# Patient Record
Sex: Male | Born: 1945 | Race: White | Hispanic: No | Marital: Married | Smoking: Never smoker
Health system: Southern US, Community
[De-identification: ages and names within clinical notes are randomized; demographics above are authoritative.]

## PROBLEM LIST (undated history)

## (undated) DIAGNOSIS — Q549 Hypospadias, unspecified: Secondary | ICD-10-CM

## (undated) DIAGNOSIS — E119 Type 2 diabetes mellitus without complications: Secondary | ICD-10-CM

## (undated) DIAGNOSIS — D75839 Thrombocytosis, unspecified: Secondary | ICD-10-CM

## (undated) DIAGNOSIS — D473 Essential (hemorrhagic) thrombocythemia: Secondary | ICD-10-CM

## (undated) DIAGNOSIS — M4628 Osteomyelitis of vertebra, sacral and sacrococcygeal region: Secondary | ICD-10-CM

## (undated) DIAGNOSIS — I10 Essential (primary) hypertension: Secondary | ICD-10-CM

## (undated) DIAGNOSIS — L89154 Pressure ulcer of sacral region, stage 4: Secondary | ICD-10-CM

## (undated) DIAGNOSIS — I5042 Chronic combined systolic (congestive) and diastolic (congestive) heart failure: Secondary | ICD-10-CM

## (undated) DIAGNOSIS — M7071 Other bursitis of hip, right hip: Secondary | ICD-10-CM

## (undated) DIAGNOSIS — E039 Hypothyroidism, unspecified: Secondary | ICD-10-CM

## (undated) HISTORY — PX: CERVICAL SPINE SURGERY: SHX589

## (undated) HISTORY — PX: TOTAL HIP ARTHROPLASTY: SHX124

## (undated) HISTORY — PX: HERNIA REPAIR: SHX51

---

## 2012-02-25 HISTORY — PX: CORONARY ARTERY BYPASS GRAFT: SHX141

## 2013-02-24 HISTORY — PX: BACK SURGERY: SHX140

## 2016-08-23 ENCOUNTER — Inpatient Hospital Stay
Admission: RE | Admit: 2016-08-23 | Discharge: 2016-09-19 | Disposition: A | Discharge status: Rehabilitation Facility | Payer: Self-pay | Source: Other Acute Inpatient Hospital | Attending: Internal Medicine | Admitting: Internal Medicine

## 2016-08-23 ENCOUNTER — Other Ambulatory Visit (HOSPITAL_COMMUNITY): Payer: Self-pay

## 2016-08-23 DIAGNOSIS — R109 Unspecified abdominal pain: Secondary | ICD-10-CM

## 2016-08-23 DIAGNOSIS — Z931 Gastrostomy status: Secondary | ICD-10-CM

## 2016-08-23 DIAGNOSIS — R1310 Dysphagia, unspecified: Secondary | ICD-10-CM

## 2016-08-23 DIAGNOSIS — J9690 Respiratory failure, unspecified, unspecified whether with hypoxia or hypercapnia: Secondary | ICD-10-CM

## 2016-08-23 DIAGNOSIS — R1032 Left lower quadrant pain: Secondary | ICD-10-CM

## 2016-08-23 DIAGNOSIS — R58 Hemorrhage, not elsewhere classified: Secondary | ICD-10-CM

## 2016-08-23 DIAGNOSIS — J189 Pneumonia, unspecified organism: Secondary | ICD-10-CM

## 2016-08-23 DIAGNOSIS — R51 Headache: Secondary | ICD-10-CM

## 2016-08-23 DIAGNOSIS — R519 Headache, unspecified: Secondary | ICD-10-CM

## 2016-08-23 DIAGNOSIS — Z452 Encounter for adjustment and management of vascular access device: Secondary | ICD-10-CM

## 2016-08-23 HISTORY — DX: Essential (primary) hypertension: I10

## 2016-08-23 HISTORY — DX: Osteomyelitis of vertebra, sacral and sacrococcygeal region: M46.28

## 2016-08-23 HISTORY — DX: Pressure ulcer of sacral region, stage 4: L89.154

## 2016-08-23 HISTORY — DX: Hypospadias, unspecified: Q54.9

## 2016-08-23 HISTORY — DX: Thrombocytosis, unspecified: D75.839

## 2016-08-23 HISTORY — DX: Type 2 diabetes mellitus without complications: E11.9

## 2016-08-23 HISTORY — DX: Essential (hemorrhagic) thrombocythemia: D47.3

## 2016-08-23 HISTORY — DX: Other bursitis of hip, right hip: M70.71

## 2016-08-23 HISTORY — DX: Chronic combined systolic (congestive) and diastolic (congestive) heart failure: I50.42

## 2016-08-23 HISTORY — DX: Hypothyroidism, unspecified: E03.9

## 2016-08-23 MED ORDER — IOPAMIDOL (ISOVUE-300) INJECTION 61%
INTRAVENOUS | Status: AC
  Administered 2016-08-23: 45 mL via GASTROSTOMY
  Filled 2016-08-23: qty 50

## 2016-08-24 LAB — COMPREHENSIVE METABOLIC PANEL
ALT: 14 U/L — AB (ref 17–63)
AST: 17 U/L (ref 15–41)
Albumin: 1.8 g/dL — ABNORMAL LOW (ref 3.5–5.0)
Alkaline Phosphatase: 77 U/L (ref 38–126)
Anion gap: 11 (ref 5–15)
BILIRUBIN TOTAL: 0.5 mg/dL (ref 0.3–1.2)
BUN: 38 mg/dL — AB (ref 6–20)
CALCIUM: 9.5 mg/dL (ref 8.9–10.3)
CO2: 29 mmol/L (ref 22–32)
CREATININE: 1.21 mg/dL (ref 0.61–1.24)
Chloride: 103 mmol/L (ref 101–111)
GFR, EST NON AFRICAN AMERICAN: 59 mL/min — AB (ref >60)
Glucose, Bld: 116 mg/dL — ABNORMAL HIGH (ref 65–99)
Potassium: 3.8 mmol/L (ref 3.5–5.1)
Sodium: 143 mmol/L (ref 135–145)
TOTAL PROTEIN: 6.3 g/dL — AB (ref 6.5–8.1)

## 2016-08-24 LAB — CBC
HCT: 31.7 % — ABNORMAL LOW (ref 39.0–52.0)
Hemoglobin: 9.1 g/dL — ABNORMAL LOW (ref 13.0–17.0)
MCH: 25.9 pg — ABNORMAL LOW (ref 26.0–34.0)
MCHC: 28.7 g/dL — ABNORMAL LOW (ref 30.0–36.0)
MCV: 90.3 fL (ref 78.0–100.0)
PLATELETS: 259 10*3/uL (ref 150–400)
RBC: 3.51 MIL/uL — AB (ref 4.22–5.81)
RDW: 18.1 % — AB (ref 11.5–15.5)
WBC: 9.9 10*3/uL (ref 4.0–10.5)

## 2016-08-28 ENCOUNTER — Other Ambulatory Visit (HOSPITAL_COMMUNITY): Payer: Self-pay

## 2016-08-30 LAB — CBC
HEMATOCRIT: 31 % — AB (ref 39.0–52.0)
HEMOGLOBIN: 9.1 g/dL — AB (ref 13.0–17.0)
MCH: 25.8 pg — ABNORMAL LOW (ref 26.0–34.0)
MCHC: 29.4 g/dL — ABNORMAL LOW (ref 30.0–36.0)
MCV: 87.8 fL (ref 78.0–100.0)
PLATELETS: 322 10*3/uL (ref 150–400)
RBC: 3.53 MIL/uL — AB (ref 4.22–5.81)
RDW: 17.8 % — ABNORMAL HIGH (ref 11.5–15.5)
WBC: 15.9 10*3/uL — AB (ref 4.0–10.5)

## 2016-08-30 LAB — BASIC METABOLIC PANEL
ANION GAP: 9 (ref 5–15)
BUN: 67 mg/dL — ABNORMAL HIGH (ref 6–20)
CO2: 32 mmol/L (ref 22–32)
Calcium: 9.3 mg/dL (ref 8.9–10.3)
Chloride: 99 mmol/L — ABNORMAL LOW (ref 101–111)
Creatinine, Ser: 1.49 mg/dL — ABNORMAL HIGH (ref 0.61–1.24)
GFR calc Af Amer: 53 mL/min — ABNORMAL LOW (ref >60)
GFR, EST NON AFRICAN AMERICAN: 46 mL/min — AB (ref >60)
GLUCOSE: 198 mg/dL — AB (ref 65–99)
POTASSIUM: 4 mmol/L (ref 3.5–5.1)
Sodium: 140 mmol/L (ref 135–145)

## 2016-08-30 LAB — TSH: TSH: 0.663 u[IU]/mL (ref 0.350–4.500)

## 2016-08-31 ENCOUNTER — Other Ambulatory Visit (HOSPITAL_COMMUNITY): Payer: Self-pay

## 2016-09-01 LAB — BASIC METABOLIC PANEL
ANION GAP: 12 (ref 5–15)
BUN: 69 mg/dL — ABNORMAL HIGH (ref 6–20)
CHLORIDE: 99 mmol/L — AB (ref 101–111)
CO2: 28 mmol/L (ref 22–32)
CREATININE: 1.33 mg/dL — AB (ref 0.61–1.24)
Calcium: 9.4 mg/dL (ref 8.9–10.3)
GFR calc non Af Amer: 53 mL/min — ABNORMAL LOW (ref >60)
Glucose, Bld: 143 mg/dL — ABNORMAL HIGH (ref 65–99)
POTASSIUM: 4.4 mmol/L (ref 3.5–5.1)
Sodium: 139 mmol/L (ref 135–145)

## 2016-09-02 ENCOUNTER — Other Ambulatory Visit (HOSPITAL_COMMUNITY): Payer: Self-pay

## 2016-09-02 LAB — CULTURE, RESPIRATORY W GRAM STAIN: Culture: NORMAL

## 2016-09-02 LAB — CULTURE, RESPIRATORY

## 2016-09-03 LAB — CK: Total CK: 107 U/L (ref 49–397)

## 2016-09-04 LAB — SEDIMENTATION RATE: Sed Rate: 90 mm/hr — ABNORMAL HIGH (ref 0–16)

## 2016-09-05 LAB — BLOOD GAS, ARTERIAL
ACID-BASE EXCESS: 4.7 mmol/L — AB (ref 0.0–2.0)
BICARBONATE: 28.8 mmol/L — AB (ref 20.0–28.0)
FIO2: 28
O2 Saturation: 96.7 %
PH ART: 7.449 (ref 7.350–7.450)
Patient temperature: 97
pCO2 arterial: 41.7 mmHg (ref 32.0–48.0)
pO2, Arterial: 82.3 mmHg — ABNORMAL LOW (ref 83.0–108.0)

## 2016-09-06 LAB — BASIC METABOLIC PANEL
ANION GAP: 9 (ref 5–15)
BUN: 33 mg/dL — ABNORMAL HIGH (ref 6–20)
CALCIUM: 9.3 mg/dL (ref 8.9–10.3)
CHLORIDE: 99 mmol/L — AB (ref 101–111)
CO2: 29 mmol/L (ref 22–32)
Creatinine, Ser: 1.08 mg/dL (ref 0.61–1.24)
GFR calc Af Amer: >60 mL/min (ref >60)
GFR calc non Af Amer: >60 mL/min (ref >60)
Glucose, Bld: 135 mg/dL — ABNORMAL HIGH (ref 65–99)
Potassium: 3.5 mmol/L (ref 3.5–5.1)
Sodium: 137 mmol/L (ref 135–145)

## 2016-09-06 LAB — CBC
HEMATOCRIT: 29 % — AB (ref 39.0–52.0)
HEMOGLOBIN: 9.2 g/dL — AB (ref 13.0–17.0)
MCH: 27.1 pg (ref 26.0–34.0)
MCHC: 31.7 g/dL (ref 30.0–36.0)
MCV: 85.5 fL (ref 78.0–100.0)
Platelets: 322 10*3/uL (ref 150–400)
RBC: 3.39 MIL/uL — ABNORMAL LOW (ref 4.22–5.81)
RDW: 16.9 % — AB (ref 11.5–15.5)
WBC: 11 10*3/uL — AB (ref 4.0–10.5)

## 2016-09-07 ENCOUNTER — Other Ambulatory Visit (HOSPITAL_COMMUNITY): Payer: Self-pay

## 2016-09-07 LAB — BASIC METABOLIC PANEL
ANION GAP: 9 (ref 5–15)
BUN: 38 mg/dL — ABNORMAL HIGH (ref 6–20)
CHLORIDE: 102 mmol/L (ref 101–111)
CO2: 27 mmol/L (ref 22–32)
Calcium: 9.3 mg/dL (ref 8.9–10.3)
Creatinine, Ser: 1.15 mg/dL (ref 0.61–1.24)
GFR calc non Af Amer: >60 mL/min (ref >60)
Glucose, Bld: 173 mg/dL — ABNORMAL HIGH (ref 65–99)
POTASSIUM: 3.7 mmol/L (ref 3.5–5.1)
Sodium: 138 mmol/L (ref 135–145)

## 2016-09-07 LAB — CBC
HEMATOCRIT: 28.5 % — AB (ref 39.0–52.0)
HEMOGLOBIN: 8.9 g/dL — AB (ref 13.0–17.0)
MCH: 26.8 pg (ref 26.0–34.0)
MCHC: 31.2 g/dL (ref 30.0–36.0)
MCV: 85.8 fL (ref 78.0–100.0)
Platelets: 286 10*3/uL (ref 150–400)
RBC: 3.32 MIL/uL — AB (ref 4.22–5.81)
RDW: 17 % — ABNORMAL HIGH (ref 11.5–15.5)
WBC: 9.7 10*3/uL (ref 4.0–10.5)

## 2016-09-10 ENCOUNTER — Other Ambulatory Visit (HOSPITAL_COMMUNITY): Payer: Self-pay

## 2016-09-10 LAB — CBC WITH DIFFERENTIAL/PLATELET
Basophils Absolute: 0.1 10*3/uL (ref 0.0–0.1)
Basophils Relative: 0 %
EOS ABS: 0.9 10*3/uL — AB (ref 0.0–0.7)
Eosinophils Relative: 7 %
HEMATOCRIT: 31.6 % — AB (ref 39.0–52.0)
HEMOGLOBIN: 9.8 g/dL — AB (ref 13.0–17.0)
LYMPHS ABS: 1.9 10*3/uL (ref 0.7–4.0)
Lymphocytes Relative: 14 %
MCH: 26.6 pg (ref 26.0–34.0)
MCHC: 31 g/dL (ref 30.0–36.0)
MCV: 85.6 fL (ref 78.0–100.0)
MONO ABS: 0.9 10*3/uL (ref 0.1–1.0)
MONOS PCT: 7 %
NEUTROS ABS: 9.8 10*3/uL — AB (ref 1.7–7.7)
Neutrophils Relative %: 72 %
Platelets: 334 10*3/uL (ref 150–400)
RBC: 3.69 MIL/uL — ABNORMAL LOW (ref 4.22–5.81)
RDW: 16.3 % — ABNORMAL HIGH (ref 11.5–15.5)
WBC: 13.6 10*3/uL — ABNORMAL HIGH (ref 4.0–10.5)

## 2016-09-10 LAB — URINALYSIS, ROUTINE W REFLEX MICROSCOPIC
Bilirubin Urine: NEGATIVE
Glucose, UA: >=500 mg/dL — AB
Ketones, ur: NEGATIVE mg/dL
NITRITE: NEGATIVE
PROTEIN: 30 mg/dL — AB
Specific Gravity, Urine: 1.013 (ref 1.005–1.030)
pH: 5 (ref 5.0–8.0)

## 2016-09-10 LAB — COMPREHENSIVE METABOLIC PANEL
ALBUMIN: 2.1 g/dL — AB (ref 3.5–5.0)
ALT: 17 U/L (ref 17–63)
AST: 19 U/L (ref 15–41)
Alkaline Phosphatase: 91 U/L (ref 38–126)
Anion gap: 7 (ref 5–15)
BUN: 50 mg/dL — ABNORMAL HIGH (ref 6–20)
CHLORIDE: 99 mmol/L — AB (ref 101–111)
CO2: 30 mmol/L (ref 22–32)
CREATININE: 1.38 mg/dL — AB (ref 0.61–1.24)
Calcium: 9.6 mg/dL (ref 8.9–10.3)
GFR calc non Af Amer: 51 mL/min — ABNORMAL LOW (ref >60)
GFR, EST AFRICAN AMERICAN: 59 mL/min — AB (ref >60)
GLUCOSE: 231 mg/dL — AB (ref 65–99)
Potassium: 3.5 mmol/L (ref 3.5–5.1)
SODIUM: 136 mmol/L (ref 135–145)
Total Bilirubin: 0.5 mg/dL (ref 0.3–1.2)
Total Protein: 6.7 g/dL (ref 6.5–8.1)

## 2016-09-12 LAB — URINE CULTURE

## 2016-09-15 ENCOUNTER — Other Ambulatory Visit (HOSPITAL_COMMUNITY): Payer: Self-pay

## 2016-09-15 MED ORDER — IOPAMIDOL (ISOVUE-300) INJECTION 61%
100.0000 mL | Freq: Once | INTRAVENOUS | Status: AC | PRN
  Administered 2016-09-15: 100 mL via INTRAVENOUS

## 2016-09-17 NOTE — PMR Pre-admission (Signed)
[facility] PMR Admission Coordinator Pre-Admission Assessment  Patient: Gregory Hester is an 71 y.o., male MRN: [id number] DOB: January 29, 1946 Height: _0  (180.3 cm) Weight: 66.7 kg (147 lb)  Insurance Information HMO:      PPO: Yes     PCP:       IPA:       80/20:       OTHER:   PRIMARY: UHC Medicare      Policy#: [id number]      Subscriber: Renard Hamper. [place] Name: Vevelyn Royals      Phone#: [phone]     Fax#: [phone] Pre-Cert#: [id number]      Employer:  Self employed Heating/Air conditioning FT Benefits:  Phone #: [phone]     Name:  Online Eff. Date: 05/25/16     Deduct: $0      Out of Pocket Max: $4000 (met $3750.00)      Life Max: N/A CIR: $160 days 1-10      SNF: $0 days 1-20; $50 days 21-100 Outpatient: medical necessity     Co-Pay: $20/visit Home Health: 100%      Co-Pay: none DME: 80%     Co-Pay: 20% Providers: in network  Medicaid Application Date:        Case Manager:   Disability Application Date:        Case Worker:    Emergency Facilities manager Information    Name Relation Home Work Mobile   [place] Spouse [phone]        Current Medical History  Patient Admitting Diagnosis: Fall from ladder with incomplete SCI  History of Present Illness: A 71 yo male was admitted to [name] after he had a fall from a ladder on 04/11/16 with 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 He was sent to [facility] for 9 weeks and was extubated.  He was discharged to [facility] in [place] for 22 hours with return to ER and admit to [facility].  He was at [facility] for another month and then was admitted to [facility] on 08/23/16 with respiratory failure and pneumonia.  He currently has a #5 extra long trach cuffless which has been capped.  He is on a dysphagia 3, nectar thick liquids diet.  He has a sacral decubitus wound with a VAC in place.  He is on contact precautions.  He was evaluated and is  being treated by PT/OT/SLP with ongoing treatments.  Patient's medical record from [facility] has been reviewed by the rehabilitation admission coordinator and physician.  Past Medical History  No past medical history on file.  Family History   family history is not on file.  Prior Rehab/Hospitalizations Has the patient had major surgery during 100 days prior to admission? Yes.  He had a hernia repair in 01/18, colostomy in 04/18, trach in the last 3 months, right wrist plating in the last 5 months.  He has been at [facility], [place] for 9 weeks, [facility] in [place] for 22 hours, [facility] for a month, and then to [place].  Current Medications See MAR from [facility]  Patients Current Diet:   Dys 3, nectar thick liquids  Precautions / Restrictions Precautions Precautions: Fall Precautions/Special Needs: Swallowing   Has the patient had 2 or more falls or a fall with injury in the past year?No  Prior Activity Level Community (5-7x/wk): Went out daily.  Worked FT in [employer].  Was driving.  Prior Functional Level Self Care: Did the patient need help bathing, dressing, using the toilet or eating?  Independent  Indoor Mobility: Did the patient need assistance with walking from room to room (with or without device)? Independent  Stairs: Did the patient need assistance with internal or external stairs (with or without device)? Independent  Functional Cognition: Did the patient need help planning regular tasks such as shopping or remembering to take medications? Independent  Home Assistive Devices / Equipment Home Assistive Devices/Equipment: None  Prior Device Use: Indicate devices/aids used by the patient prior to current illness, exacerbation or injury? None   Prior Functional Level Current Functional Level  Bed Mobility  Independent  Max assist   Transfers  Independent  Total assist    Mobility - Walk/Wheelchair  Independent  Total assist   Upper Body Dressing  Independent  Total assist   Lower Body Dressing  Independent  Total assist   Grooming  Independent  Other (Supervision)   Eating/Drinking  Independent  Other (Supervision)   Toilet Transfer  Independent  Total assist   Bladder Continence   WDL  Foley catheter   Bowel Management  WDL  Colostomy bag with BM   Stair Climbing  Independent Other (Unable)   Communication  Intact; verbal  Verbal   Memory  WDL  Mild impairment   Cooking/Meal Prep  Wife cooks      Housework  Wife does housework    Public relations account executive needs/care consideration BiPAP/CPAP No CPM No Continuous Drip IV KVO with antibiotics Dialysis No        Life Vest No Oxygen Yes, 28% trach collar Special Bed Dolphin air pressure reduction bed Trach Size #5 ELT cuffless Wound Vac (area) Yes to sacrum      Skin Decubitus ulcer sacrum with VAC in place                             Bowel mgmt: Colostomy bag Bladder mgmt: Urinary catheter Diabetic mgmt Yes, on injections at home daily and weekly  Previous Home Environment Living Arrangements: Spouse/significant other  Lives With: Spouse Available Help at Discharge: Family, Available 24 hours/day Type of Home: Apartment Home Layout: One level Home Access: Stairs to enter [employer] of Steps: 2 steps at side with plans to build a ramp.  Discharge Living Setting Plans for Discharge Living Setting: Patient's home, House, Lives with (comment) (Lives with wife.) Type of Home at Discharge: House Discharge Home Layout: One level Discharge Home Access: Stairs to enter Entrance Stairs-Number of Steps: 2 steps at side with plans to build a ramp. Does the patient have any problems obtaining your medications?: No  Social/Family/Support Systems Patient Roles: Spouse, Parent (Has a wife and 5 children.  Wife  has 2 children.) Contact Information: Yonas Bunda - wife Anticipated Caregiver: wife Anticipated Caregiver's Contact Information: Opal Sidles - wife - [phone] Ability/Limitations of Caregiver: Wife is not working.  Wife can assist. Caregiver Availability: 24/7 Discharge Plan Discussed with Primary Caregiver: Yes Is Caregiver In Agreement with Plan?: Yes Does Caregiver/Family have Issues with Lodging/Transportation while Pt is in Rehab?: No  Goals/Additional Needs Patient/Family Goal for Rehab: PT/OT mod assist, SLP supervision to mod I goals Expected length of stay: 21-24 days Cultural Considerations: Jehovah Witness - no blood products Dietary Needs: Dys 3, nectar thick liquids Equipment Needs: TBD Pt/Family Agrees to Admission and willing to participate:  Yes Program Orientation Provided & Reviewed with Pt/Caregiver Including Roles  & Responsibilities: Yes Barriers to Discharge: Home environment access/layout, Trach, Neurogenic Bowel & Bladder, Wound Care  Patient Condition: I have reviewed all medical records from [facility] and I met with patient and his wife at the bedside.  Patient has suffered incomplete paraplegia as a result of a fall from a ladder 02/18.  He will benefit from and can tolerate 3 hours of therapy a day.  He needs the coordinated approach of the inpatient rehab program to progress to a functional level where he can discharge home to his wife.  I have discussed all findings with rehab MD and have shared medical records with rehab MD.  I have rehab MD approval to admit to acute inpatient rehab.  Preadmission Screen Completed By:  Retta Diones, 09/17/2016 11:01 AM ______________________________________________________________________   Discussed status with Dr. Naaman Plummer on 09/18/16 at 1244 and received telephone approval for admission tomorrow, 09/11/2016.  Admission Coordinator:  Retta Diones, time 1247/Date 09/18/16   Assessment/Plan: Diagnosis: T5-6  incomplete SCI 1. Does the need for close, 24 hr/day  Medical supervision in concert with the patient's rehab needs make it unreasonable for this patient to be served in a less intensive setting? Yes 2. Co-Morbidities requiring supervision/potential complications: neurogenic bowel/bladder, respiratory failure, sacral wound 3. Due to bladder management, bowel management, safety, skin/wound care, disease management, medication administration, pain management and patient education, does the patient require 24 hr/day rehab nursing? Yes 4. Does the patient require coordinated care of a physician, rehab nurse, PT (1-2 hrs/day, 5 days/week), OT (1-2 hrs/day, 5 days/week) and SLP (1-2 hrs/day, 5 days/week) to address physical and functional deficits in the context of the above medical diagnosis(es)? Yes Addressing deficits in the following areas: balance, endurance, locomotion, strength, transferring, bowel/bladder control, bathing, dressing, feeding, grooming, toileting, swallowing and psychosocial support 5. Can the patient actively participate in an intensive therapy program of at least 3 hrs of therapy 5 days a week? Yes 6. The potential for patient to make measurable gains while on inpatient rehab is excellent 7. Anticipated functional outcomes upon discharge from inpatients are: mod assist PT, mod assist OT, modified independent SLP 8. Estimated rehab length of stay to reach the above functional goals is: 21-24 days 9. Does the patient have adequate social supports to accommodate these discharge functional goals? Yes 10. Anticipated D/C setting: Home 11. Anticipated post D/C treatments: [place] therapy 12. Overall Rehab/Functional Prognosis: excellent    RECOMMENDATIONS: This patient's condition is appropriate for continued rehabilitative care in the following setting: CIR Patient has agreed to participate in recommended program. Yes Note that insurance prior authorization may be required for  reimbursement for recommended care.  Comment: Admitting to inpatient rehab today  Meredith Staggers, MD, [place] 09/22/2016   Retta Diones 09/17/2016

## 2016-09-19 ENCOUNTER — Encounter (HOSPITAL_COMMUNITY): Payer: Self-pay | Admitting: Nurse Practitioner

## 2016-09-19 ENCOUNTER — Encounter: Payer: Self-pay | Admitting: Physical Medicine and Rehabilitation

## 2016-09-19 ENCOUNTER — Inpatient Hospital Stay (HOSPITAL_COMMUNITY): Payer: Medicare Other

## 2016-09-19 ENCOUNTER — Inpatient Hospital Stay (HOSPITAL_COMMUNITY)
Admission: RE | Admit: 2016-09-19 | Discharge: 2016-10-25 | DRG: 052 | Disposition: E | Payer: Medicare Other | Source: Other Acute Inpatient Hospital | Attending: Physical Medicine & Rehabilitation | Admitting: Physical Medicine & Rehabilitation

## 2016-09-19 DIAGNOSIS — K592 Neurogenic bowel, not elsewhere classified: Secondary | ICD-10-CM | POA: Insufficient documentation

## 2016-09-19 DIAGNOSIS — D72829 Elevated white blood cell count, unspecified: Secondary | ICD-10-CM

## 2016-09-19 DIAGNOSIS — R0602 Shortness of breath: Secondary | ICD-10-CM

## 2016-09-19 DIAGNOSIS — Z933 Colostomy status: Secondary | ICD-10-CM

## 2016-09-19 DIAGNOSIS — R0902 Hypoxemia: Secondary | ICD-10-CM

## 2016-09-19 DIAGNOSIS — S24101A Unspecified injury at T1 level of thoracic spinal cord, initial encounter: Secondary | ICD-10-CM | POA: Diagnosis not present

## 2016-09-19 DIAGNOSIS — N319 Neuromuscular dysfunction of bladder, unspecified: Secondary | ICD-10-CM

## 2016-09-19 DIAGNOSIS — N179 Acute kidney failure, unspecified: Secondary | ICD-10-CM | POA: Diagnosis not present

## 2016-09-19 DIAGNOSIS — Z888 Allergy status to other drugs, medicaments and biological substances status: Secondary | ICD-10-CM | POA: Diagnosis not present

## 2016-09-19 DIAGNOSIS — Z96643 Presence of artificial hip joint, bilateral: Secondary | ICD-10-CM | POA: Diagnosis present

## 2016-09-19 DIAGNOSIS — L89154 Pressure ulcer of sacral region, stage 4: Secondary | ICD-10-CM | POA: Diagnosis present

## 2016-09-19 DIAGNOSIS — E1169 Type 2 diabetes mellitus with other specified complication: Secondary | ICD-10-CM | POA: Diagnosis present

## 2016-09-19 DIAGNOSIS — J9691 Respiratory failure, unspecified with hypoxia: Secondary | ICD-10-CM | POA: Diagnosis not present

## 2016-09-19 DIAGNOSIS — I5042 Chronic combined systolic (congestive) and diastolic (congestive) heart failure: Secondary | ICD-10-CM | POA: Diagnosis present

## 2016-09-19 DIAGNOSIS — F411 Generalized anxiety disorder: Secondary | ICD-10-CM | POA: Diagnosis present

## 2016-09-19 DIAGNOSIS — Z931 Gastrostomy status: Secondary | ICD-10-CM | POA: Diagnosis not present

## 2016-09-19 DIAGNOSIS — B379 Candidiasis, unspecified: Secondary | ICD-10-CM | POA: Diagnosis not present

## 2016-09-19 DIAGNOSIS — W11XXXD Fall on and from ladder, subsequent encounter: Secondary | ICD-10-CM | POA: Diagnosis present

## 2016-09-19 DIAGNOSIS — N189 Chronic kidney disease, unspecified: Secondary | ICD-10-CM | POA: Diagnosis present

## 2016-09-19 DIAGNOSIS — I130 Hypertensive heart and chronic kidney disease with heart failure and stage 1 through stage 4 chronic kidney disease, or unspecified chronic kidney disease: Secondary | ICD-10-CM | POA: Diagnosis present

## 2016-09-19 DIAGNOSIS — Z951 Presence of aortocoronary bypass graft: Secondary | ICD-10-CM

## 2016-09-19 DIAGNOSIS — E1122 Type 2 diabetes mellitus with diabetic chronic kidney disease: Secondary | ICD-10-CM | POA: Diagnosis present

## 2016-09-19 DIAGNOSIS — E46 Unspecified protein-calorie malnutrition: Secondary | ICD-10-CM

## 2016-09-19 DIAGNOSIS — E039 Hypothyroidism, unspecified: Secondary | ICD-10-CM | POA: Diagnosis present

## 2016-09-19 DIAGNOSIS — E8809 Other disorders of plasma-protein metabolism, not elsewhere classified: Secondary | ICD-10-CM

## 2016-09-19 DIAGNOSIS — E119 Type 2 diabetes mellitus without complications: Secondary | ICD-10-CM | POA: Diagnosis present

## 2016-09-19 DIAGNOSIS — G8220 Paraplegia, unspecified: Principal | ICD-10-CM | POA: Diagnosis present

## 2016-09-19 DIAGNOSIS — Z930 Tracheostomy status: Secondary | ICD-10-CM

## 2016-09-19 DIAGNOSIS — F419 Anxiety disorder, unspecified: Secondary | ICD-10-CM | POA: Diagnosis present

## 2016-09-19 DIAGNOSIS — D62 Acute posthemorrhagic anemia: Secondary | ICD-10-CM | POA: Diagnosis present

## 2016-09-19 DIAGNOSIS — E876 Hypokalemia: Secondary | ICD-10-CM | POA: Diagnosis not present

## 2016-09-19 DIAGNOSIS — R410 Disorientation, unspecified: Secondary | ICD-10-CM | POA: Diagnosis not present

## 2016-09-19 DIAGNOSIS — R339 Retention of urine, unspecified: Secondary | ICD-10-CM | POA: Diagnosis not present

## 2016-09-19 LAB — URINALYSIS, ROUTINE W REFLEX MICROSCOPIC
Bilirubin Urine: NEGATIVE
Glucose, UA: >=500 mg/dL — AB
HGB URINE DIPSTICK: NEGATIVE
Ketones, ur: NEGATIVE mg/dL
NITRITE: NEGATIVE
PROTEIN: 100 mg/dL — AB
SPECIFIC GRAVITY, URINE: 1.012 (ref 1.005–1.030)
pH: 6 (ref 5.0–8.0)

## 2016-09-19 LAB — GLUCOSE, CAPILLARY
GLUCOSE-CAPILLARY: 175 mg/dL — AB (ref 65–99)
Glucose-Capillary: 181 mg/dL — ABNORMAL HIGH (ref 65–99)

## 2016-09-19 MED ORDER — POLYETHYLENE GLYCOL 3350 17 G PO PACK: 17.0000 g | PACK | Freq: Every day | ORAL | Status: DC | PRN

## 2016-09-19 MED ORDER — PROCHLORPERAZINE EDISYLATE 5 MG/ML IJ SOLN: 5.0000 mg | Freq: Four times a day (QID) | INTRAMUSCULAR | Status: DC | PRN

## 2016-09-19 MED ORDER — DIPHENHYDRAMINE HCL 12.5 MG/5ML PO ELIX: 12.5000 mg | ORAL_SOLUTION | Freq: Four times a day (QID) | ORAL | Status: DC | PRN

## 2016-09-19 MED ORDER — PROCHLORPERAZINE 25 MG RE SUPP: 12.5000 mg | Freq: Four times a day (QID) | RECTAL | Status: DC | PRN

## 2016-09-19 MED ORDER — GUAIFENESIN-DM 100-10 MG/5ML PO SYRP: 5.0000 mL | ORAL_SOLUTION | Freq: Four times a day (QID) | ORAL | Status: DC | PRN

## 2016-09-19 MED ORDER — BISACODYL 10 MG RE SUPP: 10.0000 mg | Freq: Every day | RECTAL | Status: DC | PRN

## 2016-09-19 MED ORDER — BACID PO TABS
2.0000 | ORAL_TABLET | Freq: Three times a day (TID) | ORAL | Status: DC
  Administered 2016-09-19 – 2016-09-24 (×16): 2 via ORAL
  Filled 2016-09-19 (×19): qty 2

## 2016-09-19 MED ORDER — PRO-STAT SUGAR FREE PO LIQD
30.0000 mL | Freq: Two times a day (BID) | ORAL | Status: DC
  Administered 2016-09-19 – 2016-09-22 (×6): 30 mL via ORAL
  Filled 2016-09-19 (×6): qty 30

## 2016-09-19 MED ORDER — ALUM & MAG HYDROXIDE-SIMETH 200-200-20 MG/5ML PO SUSP: 30.0000 mL | ORAL | Status: DC | PRN

## 2016-09-19 MED ORDER — NYSTATIN 100000 UNIT/GM EX POWD
Freq: Three times a day (TID) | CUTANEOUS | Status: DC
  Administered 2016-09-19 – 2016-09-21 (×5): via TOPICAL
  Administered 2016-09-21: 1 via TOPICAL
  Administered 2016-09-21 – 2016-09-24 (×10): via TOPICAL
  Filled 2016-09-19: qty 15

## 2016-09-19 MED ORDER — FERROUS SULFATE 300 (60 FE) MG/5ML PO SYRP
300.0000 mg | ORAL_SOLUTION | Freq: Two times a day (BID) | ORAL | Status: DC
  Administered 2016-09-19 – 2016-09-24 (×11): 300 mg via ORAL
  Filled 2016-09-19 (×14): qty 5

## 2016-09-19 MED ORDER — MELATONIN 3 MG PO TABS
3.0000 mg | ORAL_TABLET | Freq: Every day | ORAL | Status: DC
  Administered 2016-09-19 – 2016-09-24 (×6): 3 mg via ORAL
  Filled 2016-09-19 (×6): qty 1

## 2016-09-19 MED ORDER — FLEET ENEMA 7-19 GM/118ML RE ENEM: 1.0000 | ENEMA | Freq: Once | RECTAL | Status: DC | PRN

## 2016-09-19 MED ORDER — LEVOTHYROXINE SODIUM 100 MCG PO TABS
300.0000 ug | ORAL_TABLET | Freq: Every day | ORAL | Status: DC
  Administered 2016-09-20 – 2016-09-24 (×5): 300 ug via ORAL
  Filled 2016-09-19 (×5): qty 3

## 2016-09-19 MED ORDER — ACETAMINOPHEN 325 MG PO TABS
325.0000 mg | ORAL_TABLET | ORAL | Status: DC | PRN
  Administered 2016-09-19 – 2016-09-21 (×6): 650 mg via ORAL
  Filled 2016-09-19 (×6): qty 2

## 2016-09-19 MED ORDER — QUETIAPINE FUMARATE 50 MG PO TABS
50.0000 mg | ORAL_TABLET | Freq: Every day | ORAL | Status: DC
  Administered 2016-09-19 – 2016-09-24 (×6): 50 mg via ORAL
  Filled 2016-09-19 (×6): qty 1

## 2016-09-19 MED ORDER — LOSARTAN POTASSIUM 50 MG PO TABS
50.0000 mg | ORAL_TABLET | Freq: Every day | ORAL | Status: DC
  Administered 2016-09-20 – 2016-09-24 (×5): 50 mg via ORAL
  Filled 2016-09-19 (×5): qty 1

## 2016-09-19 MED ORDER — MODAFINIL 100 MG PO TABS
100.0000 mg | ORAL_TABLET | Freq: Two times a day (BID) | ORAL | Status: DC
  Administered 2016-09-20 – 2016-09-24 (×10): 100 mg via ORAL
  Filled 2016-09-19 (×10): qty 1

## 2016-09-19 MED ORDER — INSULIN ASPART 100 UNIT/ML ~~LOC~~ SOLN
0.0000 [IU] | Freq: Three times a day (TID) | SUBCUTANEOUS | Status: DC
  Administered 2016-09-20: 1 [IU] via SUBCUTANEOUS
  Administered 2016-09-20: 2 [IU] via SUBCUTANEOUS
  Administered 2016-09-20: 1 [IU] via SUBCUTANEOUS
  Administered 2016-09-21: 2 [IU] via SUBCUTANEOUS
  Administered 2016-09-21: 1 [IU] via SUBCUTANEOUS
  Administered 2016-09-22: 3 [IU] via SUBCUTANEOUS
  Administered 2016-09-23 – 2016-09-24 (×2): 2 [IU] via SUBCUTANEOUS

## 2016-09-19 MED ORDER — TRAZODONE HCL 50 MG PO TABS
50.0000 mg | ORAL_TABLET | Freq: Every day | ORAL | Status: DC
  Administered 2016-09-19 – 2016-09-23 (×5): 50 mg via ORAL
  Filled 2016-09-19 (×5): qty 1

## 2016-09-19 MED ORDER — PROCHLORPERAZINE MALEATE 5 MG PO TABS: 5.0000 mg | ORAL_TABLET | Freq: Four times a day (QID) | ORAL | Status: DC | PRN

## 2016-09-19 MED ORDER — BUSPIRONE HCL 5 MG PO TABS
5.0000 mg | ORAL_TABLET | Freq: Two times a day (BID) | ORAL | Status: DC
  Administered 2016-09-19 – 2016-09-24 (×11): 5 mg via ORAL
  Filled 2016-09-19 (×11): qty 1

## 2016-09-19 MED ORDER — HYDROCERIN EX CREA
TOPICAL_CREAM | Freq: Two times a day (BID) | CUTANEOUS | Status: DC
  Administered 2016-09-19 – 2016-09-20 (×3): via TOPICAL
  Administered 2016-09-21: 1 via TOPICAL
  Administered 2016-09-21 – 2016-09-24 (×7): via TOPICAL
  Filled 2016-09-19: qty 113

## 2016-09-19 MED ORDER — ADULT MULTIVITAMIN W/MINERALS CH
1.0000 | ORAL_TABLET | Freq: Every day | ORAL | Status: DC
  Administered 2016-09-20 – 2016-09-24 (×5): 1 via ORAL
  Filled 2016-09-19 (×5): qty 1

## 2016-09-19 MED ORDER — SENNOSIDES-DOCUSATE SODIUM 8.6-50 MG PO TABS
2.0000 | ORAL_TABLET | Freq: Every day | ORAL | Status: DC
  Administered 2016-09-19 – 2016-09-24 (×6): 2 via ORAL
  Filled 2016-09-19 (×6): qty 2

## 2016-09-19 MED ORDER — NON FORMULARY: 3.0000 mg | Freq: Every day | Status: DC

## 2016-09-19 MED ORDER — PANTOPRAZOLE SODIUM 40 MG PO TBEC
40.0000 mg | DELAYED_RELEASE_TABLET | Freq: Every day | ORAL | Status: DC
  Administered 2016-09-20 – 2016-09-24 (×5): 40 mg via ORAL
  Filled 2016-09-19 (×5): qty 1

## 2016-09-19 MED ORDER — ISOSORBIDE DINITRATE 10 MG PO TABS
10.0000 mg | ORAL_TABLET | Freq: Three times a day (TID) | ORAL | Status: DC
  Administered 2016-09-19 – 2016-09-24 (×16): 10 mg via ORAL
  Filled 2016-09-19 (×19): qty 1

## 2016-09-19 MED ORDER — ENOXAPARIN SODIUM 40 MG/0.4ML ~~LOC~~ SOLN
40.0000 mg | SUBCUTANEOUS | Status: DC
  Administered 2016-09-19 – 2016-09-24 (×6): 40 mg via SUBCUTANEOUS
  Filled 2016-09-19 (×6): qty 0.4

## 2016-09-19 MED ORDER — INSULIN GLARGINE 100 UNIT/ML ~~LOC~~ SOLN
10.0000 [IU] | Freq: Every day | SUBCUTANEOUS | Status: DC
  Administered 2016-09-19 – 2016-09-23 (×5): 10 [IU] via SUBCUTANEOUS
  Filled 2016-09-19 (×5): qty 0.1

## 2016-09-19 MED ORDER — RESOURCE THICKENUP CLEAR PO POWD
ORAL | Status: DC | PRN
  Filled 2016-09-19 (×2): qty 125

## 2016-09-19 MED ORDER — FREE WATER
100.0000 mL | Freq: Three times a day (TID) | Status: DC
  Administered 2016-09-19 – 2016-09-24 (×15): 100 mL

## 2016-09-19 MED ORDER — INSULIN ASPART 100 UNIT/ML ~~LOC~~ SOLN
0.0000 [IU] | Freq: Every day | SUBCUTANEOUS | Status: DC
  Administered 2016-09-20 – 2016-09-23 (×3): 2 [IU] via SUBCUTANEOUS
  Administered 2016-09-24: 3 [IU] via SUBCUTANEOUS

## 2016-09-19 MED ORDER — BUDESONIDE 0.25 MG/2ML IN SUSP
0.2500 mg | Freq: Two times a day (BID) | RESPIRATORY_TRACT | Status: DC
  Administered 2016-09-19 – 2016-09-23 (×8): 0.25 mg via RESPIRATORY_TRACT
  Filled 2016-09-19 (×9): qty 2

## 2016-09-19 NOTE — Progress Notes (Signed)
Meredith Staggers, MD Physician Signed Physical Medicine and Rehabilitation  PMR Pre-admission Date of Service: 09/17/2016 11:01 AM  Related encounter: Admission (Current) from 08/23/2016 in [facility]       [] Hide copied text [] Hover for attribution information   [facility] PMR Admission Coordinator Pre-Admission Assessment  Patient: Gregory Hester is an 71 y.o., male MRN: [id number] DOB: 04-Jun-1945 Height: 5' 11"  (180.3 cm) Weight: 66.7 kg (147 lb)  Insurance Information HMO:      PPO: Yes     PCP:       IPA:       80/20:       OTHER:   PRIMARY: UHC Medicare      Policy#: [id number]      Subscriber: Gregory Hester Name: Gregory Hester      Phone#: [phone]     Fax#: [phone] Pre-Cert#: [id number]      Employer:  Self employed Heating/Air conditioning FT Benefits:  Phone #: [phone]     Name:  Online Eff. Date: 05/25/16     Deduct: $0      Out of Pocket Max: $4000 (met $3750.00)      Life Max: N/A CIR: $160 days 1-10      SNF: $0 days 1-20; $50 days 21-100 Outpatient: medical necessity     Co-Pay: $20/visit Home Health: 100%      Co-Pay: none DME: 80%     Co-Pay: 20% Providers: in network  Medicaid Application Date:        Case Manager:   Disability Application Date:        Case Worker:    Emergency Tax adviser Information    Name Relation Home Work Mobile   [place] Spouse [phone]        Current Medical History  Patient Admitting Diagnosis: Fall from ladder with incomplete SCI  History of Present Illness: A 71 yo male was admitted to [facility] after he had a fall from a ladder on 04/11/16 with 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 He was sent to [facility] for 9 weeks and was extubated.  He was discharged to [facility] in [place] for 22 hours with return to ER and admit to [facility].  He was at [facility] for another month and then was admitted to [facility] on 08/23/16 with respiratory failure and pneumonia.  He currently has a #5 extra long trach cuffless which has been capped.  He is on a dysphagia 3, nectar thick liquids diet.  He has a sacral decubitus wound with a VAC in place.  He is on contact precautions.  He was evaluated and is being treated by PT/OT/SLP with ongoing treatments.  Patient's medical record from [facility] has been reviewed by the rehabilitation admission coordinator and physician.  Past Medical History  No past medical history on file.  Family History   family history is not on file.  Prior Rehab/Hospitalizations Has the patient had major surgery during 100 days prior to admission? Yes.  He had a hernia repair in 01/18, colostomy in 04/18, trach in the last 3 months, right wrist plating in the last 5 months.  He has been at [facility], [place] for 9 weeks, [facility] in [place] for 22 hours, [facility] for a month, and then to [place].  Current Medications See MAR from [facility]  Patients Current Diet:   Dys  3, nectar thick liquids  Precautions / Restrictions Precautions Precautions: Fall Precautions/Special Needs: Swallowing   Has the patient had 2 or more falls or a fall with injury in the past year?No  Prior Activity Level Community (5-7x/wk): Went out daily.  Worked FT in [employer].  Was driving.  Prior Functional Level Self Care: Did the patient need help bathing, dressing, using the toilet or eating?  Independent  Indoor Mobility: Did the patient need assistance with walking from room to room (with or without device)? Independent  Stairs: Did the patient need assistance with internal or external stairs (with or without device)? Independent  Functional Cognition: Did the patient need help planning regular tasks such as shopping or remembering to take medications? Independent  Home  Assistive Devices / Equipment Home Assistive Devices/Equipment: None  Prior Device Use: Indicate devices/aids used by the patient prior to current illness, exacerbation or injury? None   Prior Functional Level Current Functional Level  Bed Mobility  Independent  Max assist   Transfers  Independent  Total assist   Mobility - Walk/Wheelchair  Independent  Total assist   Upper Body Dressing  Independent  Total assist   Lower Body Dressing  Independent  Total assist   Grooming  Independent  Other (Supervision)   Eating/Drinking  Independent  Other (Supervision)   Toilet Transfer  Independent  Total assist   Bladder Continence   WDL  Foley catheter   Bowel Management  WDL  Colostomy bag with BM   Stair Climbing  Independent Other (Unable)   Communication  Intact; verbal  Verbal   Memory  WDL  Mild impairment   Cooking/Meal Prep  Wife cooks      Housework  Wife does housework    Public relations account executive needs/care consideration BiPAP/CPAP No CPM No Continuous Drip IV KVO with antibiotics Dialysis No        Life Vest No Oxygen Yes, 28% trach collar Special Bed Dolphin air pressure reduction bed Trach Size #5 ELT cuffless Wound Vac (area) Yes to sacrum      Skin Decubitus ulcer sacrum with VAC in place                             Bowel mgmt: Colostomy bag Bladder mgmt: Urinary catheter Diabetic mgmt Yes, on injections at home daily and weekly  Previous Home Environment Living Arrangements: Spouse/significant other  Lives With: Spouse Available Help at Discharge: Family, Available 24 hours/day Type of Home: Apartment Home Layout: One level Home Access: Stairs to enter [employer] of Steps: 2 steps at side with plans to build a ramp.  Discharge Living Setting Plans for Discharge Living Setting: Patient's home, House, Lives with  (comment) (Lives with wife.) Type of Home at Discharge: House Discharge Home Layout: One level Discharge Home Access: Stairs to enter Entrance Stairs-Number of Steps: 2 steps at side with plans to build a ramp. Does the patient have any problems obtaining your medications?: No  Social/Family/Support Systems Patient Roles: Spouse, Parent (Has a wife and 5 children.  Wife has 2 children.) Contact Information: Gregory Hester - wife Anticipated Caregiver: wife Anticipated Caregiver's Contact Information: Gregory Hester - wife - [phone] Ability/Limitations of Caregiver: Wife is not working.  Wife can assist. Caregiver Availability: 24/7 Discharge Plan Discussed with Primary Caregiver: Yes Is Caregiver In Agreement with Plan?: Yes Does Caregiver/Family have Issues with Lodging/Transportation while Pt  is in Rehab?: No  Goals/Additional Needs Patient/Family Goal for Rehab: PT/OT mod assist, SLP supervision to mod I goals Expected length of stay: 21-24 days Cultural Considerations: Jehovah Witness - no blood products Dietary Needs: Dys 3, nectar thick liquids Equipment Needs: TBD Pt/Family Agrees to Admission and willing to participate: Yes Program Orientation Provided & Reviewed with Pt/Caregiver Including Roles  & Responsibilities: Yes Barriers to Discharge: Home environment access/layout, Trach, Neurogenic Bowel & Bladder, Wound Care  Patient Condition: I have reviewed all medical records from [facility] and I met with patient and his wife at the bedside.  Patient has suffered incomplete paraplegia as a result of a fall from a ladder 02/18.  He will benefit from and can tolerate 3 hours of therapy a day.  He needs the coordinated approach of the inpatient rehab program to progress to a functional level where he can discharge home to his wife.  I have discussed all findings with rehab MD and have shared medical records with rehab MD.  I have rehab MD approval to admit to acute  inpatient rehab.  Preadmission Screen Completed By:  Gregory Hester, 09/17/2016 11:01 AM ______________________________________________________________________   Discussed status with Dr. Naaman Plummer on 09/18/16 at 1244 and received telephone approval for admission tomorrow, 08/24/2016.  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 Does the need for close, 24 hr/day  Medical supervision in concert with the patient's rehab needs make it unreasonable for this patient to be served in a less intensive setting? Yes 2. Co-Morbidities requiring supervision/potential complications: neurogenic bowel/bladder, respiratory failure, sacral wound 3. Due to bladder management, bowel management, safety, skin/wound care, disease management, medication administration, pain management and patient education, does the patient require 24 hr/day rehab nursing? Yes 4. Does the patient require coordinated care of a physician, rehab nurse, PT (1-2 hrs/day, 5 days/week), OT (1-2 hrs/day, 5 days/week) and SLP (1-2 hrs/day, 5 days/week) to address physical and functional deficits in the context of the above medical diagnosis(es)? Yes Addressing deficits in the following areas: balance, endurance, locomotion, strength, transferring, bowel/bladder control, bathing, dressing, feeding, grooming, toileting, swallowing and psychosocial support 5. Can the patient actively participate in an intensive therapy program of at least 3 hrs of therapy 5 days a week? Yes 6. The potential for patient to make measurable gains while on inpatient rehab is excellent 7. Anticipated functional outcomes upon discharge from inpatients are: mod assist PT, mod assist OT, modified independent SLP 8. Estimated rehab length of stay to reach the above functional goals is: 21-24 days 9. Does the patient have adequate social supports to accommodate these discharge functional goals?  Yes 10. Anticipated D/C setting: Home 11. Anticipated post D/C treatments: [place] therapy 12. Overall Rehab/Functional Prognosis: excellent    RECOMMENDATIONS: This patient's condition is appropriate for continued rehabilitative care in the following setting: CIR Patient has agreed to participate in recommended program. Yes Note that insurance prior authorization may be required for reimbursement for recommended care.  Comment: Admitting to inpatient rehab today  Meredith Staggers, MD, [place] [facility] 09/15/2016   Gregory Hester 09/17/2016    Revision History

## 2016-09-19 NOTE — Progress Notes (Signed)
Patient ID: Gregory Hester, male   DOB: Oct 25, 1945, 71 y.o.   MRN: [id number] Patient admitted to 4W07 via bed, escorted by [facility] nursing staff and spouse.  Patient and spouse verbalized understanding of rehab process, all questions answered to their satisfaction.  Patient has foley catheter, colostomy, trach (capped), stage I to right hip, and gaping wound to sacrum.  WOC called for assistance with wound vac.  Patient placed on air mattress.  Brita Romp, RN

## 2016-09-19 NOTE — H&P (Signed)
Physical Medicine and Rehabilitation Admission H&P   CC: Incomplete SCI  HPI: Gregory Hester is a 71 year old male originally admitted to [facility] on 04/11/16 after fall off a ladder while working with SCI with incomplete T5-T6 paraplegia, multiple skull fractures, hangman's fracture and right distal radius fracture.  He underwentT2-T6 posterior decompression, ORIF right distal radius fracture, tracheostomy-XLT and was discharged to [facility] for vent wean on 05/12/16. He was extubated but trach dependent and sent to [facility] in [place] for 22 hours whereupon he was readmitted via ER to [facility] center for a month. He was treated for Aspiration pneumonitis(pea taken out with suctioning) with respiratory failure. Hospital course significant for worsening of dysphagia, stage IV sacral decub with osteomyelitis and cultures positive for VRE and pseudomonas. Palliative care consulted and patient elected on full medical treatment and he was transferred to [facility] on 08/23/16 as vent dependent at nights.   CT head done due to headaches and showed moderate atrophy and chronic microvascular disease. Wound treated with daptomycin thorough 7/23 and meropenum added due to ESBL Klebsiella UTI. Trach downsized on 7/22 and he is tolerating capping trials during the day since 7/24--no plans to decannulate at this time per pulmonary. He has been tolerating dysphagia 3, nectar diet with po intake at 25- 75%. .  Medications being added to help with chronic systolic CHF and elevated blood pressures.  Blood sugars have been trending upwards and lantus dose being adjusted. Sacral decub decrease in size and now 10 X 8.5 cm with 1 cm depth. Patient showing improvement in activity tolerance and    Review of Systems  Constitutional: Negative for diaphoresis.  HENT: Negative for hearing loss.   Eyes: Negative for blurred vision and double vision.  Respiratory: Positive for sputum production and shortness of breath.     Cardiovascular: Negative for chest pain and palpitations.  Gastrointestinal: Negative for constipation, heartburn and nausea.  Neurological: Positive for sensory change, focal weakness and weakness. Negative for dizziness.  Psychiatric/Behavioral: Positive for memory loss. The patient is nervous/anxious.      Past Medical History:  Diagnosis Date  . Bursitis of hip, right   . Chronic combined systolic (congestive) and diastolic (congestive) heart failure ([place])   . Diabetes mellitus ([place])   . HTN (hypertension)   . Hypospadias   . Hypothyroid   . Osteomyelitis of sacrum ([place])   . Sacral decubitus ulcer, stage IV ([place])   . Thrombocytosis ([place])      Past Surgical History:  Procedure Laterality Date  . BACK SURGERY  2015  . CERVICAL SPINE SURGERY    . CORONARY ARTERY BYPASS GRAFT  2014  . HERNIA REPAIR    . TOTAL HIP ARTHROPLASTY Right   . TOTAL HIP ARTHROPLASTY  left     Family History  Problem Relation Age of Onset  . Cancer Mother      Social History:  Married. Independent and self employed (was working in [employer]). Does not use tobacco or illicit drugs. Used alcohol occasionally.    Allergies  Allergen Reactions  . Zocor [Simvastatin]     myalgias     No prescriptions prior to admission.    Home: Home Living Living Arrangements: Spouse/significant other Available Help at Discharge: Family, Available 24 hours/day Type of Home: Apartment Home Access: Stairs to enter [employer] of Steps: 2 steps at side with plans to build a ramp. Home Layout: One level  Lives With: Spouse   Functional History: Prior Function  Level of Independence: Independent  Functional Status:  Mobility:          ADL:    Cognition:       Height 5\' 11"  (1.803 m), weight 66.7 kg (147 lb). Physical Exam  Nursing note and vitals reviewed. Constitutional: He is oriented to person, place, and time. He appears well-developed and well-nourished. He has a sickly  appearance.  Up in bed drinking soda with ice.   HENT:  Head: Normocephalic and atraumatic.  Mouth/Throat: Oropharynx is clear and moist.  Eyes: Pupils are equal, round, and reactive to light. Conjunctivae are normal.  Neck: Normal range of motion. Neck supple.  #5 Trach in place with cork. Weak,hoarse voice.   Cardiovascular: Normal rate and regular rhythm.   Occasional extrasystoles are present. Exam reveals no friction rub.   No murmur heard. Respiratory: Effort normal. No stridor. No respiratory distress. He has decreased breath sounds. He has no wheezes.  GI: Soft. Bowel sounds are normal. He exhibits no distension. There is no tenderness.  PEG site clean and dry. Colostomy patent.  Genitourinary:  Genitourinary Comments: Foley in place. Inguinal area red and macerated appearing.    Musculoskeletal: He exhibits no edema or tenderness.  Neurological: He is alert and oriented to person, place, and time.  Anxious appearing male. Able to answer orientation question but overwhelmed easily. He is able to follow simple motor commands. UE motor 4/5 prox to distal. LE: 0/5 bilaterally throughout. Sensation diminished below mid trunk. Can sense general touch in legs but cannot sense pain. No resting tone. DTR's 1+ to 2+  Skin: Skin is warm and dry.  4 cm Macerated area around sacral decub. Rectal area with MASD and dried secretions.   Psychiatric: His mood appears anxious. His speech is rapid and/or pressured. He is agitated. Cognition and memory are impaired.  Easily agitated. Started hyperventilating at attempts to get medical hx--unable to recall fully.     No results found for this or any previous visit (from the past 48 hour(s)). No results found.     Medical Problem List and Plan: 1.  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  DVT Prophylaxis/Anticoagulation: Pharmaceutical: Lovenox 3. Pain Management: Oxycodone prn 4. Mood: team to  provide ego support. LCSW to follow for evaluation and support 5. Neuropsych: This patient is not fully capable of making decisions on his own behalf. 6. Skin/Wound Care: Wound VAC to sacral area--has completed antibiotic regimen for osteomyelitis. Boost every 30 minutes when in chair.  7. Fluids/Electrolytes/Nutrition: strict I/O. Continue protein supplements. Check lytes in am.  8. T2DM: Monitor BS ac/hs. Continue Lantus insulin with SSI for elevated BS.  9. Chronic combined systolic/diastolic CHF: Monitor for signs of overload. Check weight daily. Heart healthy diet. On hydralazine, losartan and imdur. Intolerant of statins.  10. Anxiety disorder: Continue buspar bid with Seroquel at nights. 11. Hypothyroid: On synthroid for supplement. Check TSH in am.  12. TBI: On modafinil for attention/activation?  13. ABLA: improving. Continue iron supplement.  14. Acute on chronic renal failure?:  Will add water flushes via PEG.  15. Intermittent leucocytosis: Monitor for signs of infection. Most recently treated for Kleb UTI. Will recheck culture for clearing.  16. Stage IV sacral decub: Air mattress overlay. WOC for input. Continue wound VAC. Add vitamin to promote healing. Calorie count--may need tube feeds at nights as intake variable and has high nutritional needs. Albumin 2.1.  17. Intermittent abdominal pain: CT scan  7/23 negative for obst or inflammation.  51. Trach dependent: Encourage IS due to atelectasis. SOB likely psychogenic. Will check CXR as drinking thin liquids.   -think he has the capacity for decannulation. Consider next week depending upon how he does over the weekend. Trached capped currrently    Post Admission Physician Evaluation: 1. Functional deficits secondary  to T5 SCI/debility. 2. Patient is admitted to receive collaborative, interdisciplinary care between the physiatrist, rehab nursing staff, and therapy team. 3. Patient's level of medical complexity and substantial  therapy needs in context of that medical necessity cannot be provided at a lesser intensity of care such as a SNF. 4. Patient has experienced substantial functional loss from his/her baseline which was documented above under the "Functional History" and "Functional Status" headings.  Judging by the patient's diagnosis, physical exam, and functional history, the patient has potential for functional progress which will result in measurable gains while on inpatient rehab.  These gains will be of substantial and practical use upon discharge  in facilitating mobility and self-care at the household level. 5. Physiatrist will provide 24 hour management of medical needs as well as oversight of the therapy plan/treatment and provide guidance as appropriate regarding the interaction of the two. 6. The Preadmission Screening has been reviewed and patient status is unchanged unless otherwise stated above. 7. 24 hour rehab nursing will assist with bladder management, bowel management, safety, skin/wound care, disease management, medication administration, pain management and patient education  and help integrate therapy concepts, techniques,education, etc. 8. PT will assess and treat for/with: Lower extremity strength, range of motion, stamina, balance, functional mobility, safety, adaptive techniques and equipment, trunk control, w/c assessment, family .   Goals are: mod assist at w/c level. 9. OT will assess and treat for/with: ADL's, functional mobility, safety, upper extremity strength, adaptive techniques and equipment, NMR, family education, ego support.   Goals are: mod assist. Therapy may not yet proceed with showering this patient. 10. SLP will assess and treat for/with: speech, swalowing, communication.  Goals are: mod I. 11. Case Management and Social Worker will assess and treat for psychological issues and discharge planning. 12. Team conference will be held weekly to assess progress toward goals and to  determine barriers to discharge. 13. Patient will receive at least 3 hours of therapy per day at least 5 days per week. 14. ELOS: 22-28 days       15. Prognosis:  excellent     Meredith Staggers, MD, [facility] 09/20/2016  Bary Leriche, Hershal Coria 08/31/2016

## 2016-09-19 NOTE — Consult Note (Addendum)
[facility] Nurse wound consult note Reason for Consult: Consult requested to apply Vac dressing to sacrum pressure injury; pt had one prior to admission at [facility] type: Chronic stage 4 pressure injury Pressure Injury POA: Yes Measurement: 10X9X1.5 cm Wound bed: Beefy red with bone visible Drainage (amount, consistency, odor) mod amt yellow drainage, no odor Periwound: Intact skin surrounding Dressing procedure/placement/frequency: Applied one piece black foam and barrier ring around wound edges to attempt to maintain seal at 124mm cont suction.  Bridged track pad to left hip to reduce pressure. WOC will plan to change Vac dressing Q M/W/F at 0800.  Pt is on an air mattress to reduce pressure.  Family member at the bedside to discuss plan of care.  [facility] Nurse ostomy consult note Stoma type/location: Colostomy pouch intact with good seal to LLQ.  Pt states ostomy has been present several months. Stomal assessment/size: Stoma red and viable when visualized through pouch, which is intact with good seal and pt states it was changed today at [facility]. Output: Mod amt semiformed brown stool  Ostomy pouching: 1pc. convex pouch Education provided:  Supplies ordered to room for staff nurse use. Enrolled patient in [place] program: No Julien Girt MSN, [place], [place], [place], [place]

## 2016-09-19 NOTE — Progress Notes (Signed)
Patient information reviewed and entered into eRehab system by Shaletha Humble, RN, CRRN, PPS Coordinator.  Information including medical coding and functional independence measure will be reviewed and updated through discharge.     Per nursing patient was given "Data Collection Information Summary for Patients in Inpatient Rehabilitation Facilities with attached "Privacy Act Statement-Health Care Records" upon admission.  

## 2016-09-19 NOTE — H&P (Signed)
Physical Medicine and Rehabilitation Admission H&P   CC: Incomplete SCI  HPI: Gregory Hester is a 71 year old male originally admitted to [facility] on 04/11/16 after fall off a ladder while working with SCI with incomplete T5-T6 paraplegia, multiple skull fractures, hangman's fracture and right distal radius fracture.  He underwentT2-T6 posterior decompression, ORIF right distal radius fracture, tracheostomy-XLT and was discharged to [facility] for vent wean on 05/12/16. He was extubated but trach dependent and sent to [facility] in [place] for 22 hours whereupon he was readmitted via ER to [facility] center for a month. He was treated for Aspiration pneumonitis(pea taken out with suctioning) with respiratory failure. Hospital course significant for worsening of dysphagia, stage IV sacral decub with osteomyelitis and cultures positive for VRE and pseudomonas. Palliative care consulted and patient elected on full medical treatment and he was transferred to [facility] on 08/23/16 as vent dependent at nights.   CT head done due to headaches and showed moderate atrophy and chronic microvascular disease. Wound treated with daptomycin thorough 7/23 and meropenum added due to ESBL Klebsiella UTI. Trach downsized on 7/22 and he is tolerating capping trials during the day since 7/24--no plans to decannulate at this time per pulmonary. He has been tolerating dysphagia 3, nectar diet with po intake at 25- 75%. .  Medications being added to help with chronic systolic CHF and elevated blood pressures.  Blood sugars have been trending upwards and lantus dose being adjusted. Sacral decub decrease in size and now 10 X 8.5 cm with 1 cm depth. Patient showing improvement in activity tolerance and    Review of Systems  Constitutional: Negative for diaphoresis.  HENT: Negative for hearing loss.   Eyes: Negative for blurred vision and double vision.  Respiratory: Positive for sputum production and shortness of  breath.   Cardiovascular: Negative for chest pain and palpitations.  Gastrointestinal: Negative for constipation, heartburn and nausea.  Neurological: Positive for sensory change, focal weakness and weakness. Negative for dizziness.  Psychiatric/Behavioral: Positive for memory loss. The patient is nervous/anxious.      Past Medical History:  Diagnosis Date  . Bursitis of hip, right   . Chronic combined systolic (congestive) and diastolic (congestive) heart failure ([place])   . Diabetes mellitus ([place])   . HTN (hypertension)   . Hypospadias   . Hypothyroid   . Osteomyelitis of sacrum ([place])   . Sacral decubitus ulcer, stage IV ([place])   . Thrombocytosis ([place])           Past Surgical History:  Procedure Laterality Date  . BACK SURGERY  2015  . CERVICAL SPINE SURGERY    . CORONARY ARTERY BYPASS GRAFT  2014  . HERNIA REPAIR    . TOTAL HIP ARTHROPLASTY Right   . TOTAL HIP ARTHROPLASTY  left          Family History  Problem Relation Age of Onset  . Cancer Mother      Social History:  Married. Independent and self employed (was working in [employer]). Does not use tobacco or illicit drugs. Used alcohol occasionally.         Allergies  Allergen Reactions  . Zocor [Simvastatin]     myalgias     No prescriptions prior to admission.    Home: Home Living Living Arrangements: Spouse/significant other Available Help at Discharge: Family, Available 24 hours/day Type of Home: Apartment Home Access: Stairs to enter [employer] of Steps: 2 steps at side with plans to build a ramp.  Home Layout: One level  Lives With: Spouse   Functional History: Prior Function Level of Independence: Independent  Functional Status:  Mobility:  ADL:  Cognition:   Height 5\' 11"  (1.803 m), weight 66.7 kg (147 lb). Physical Exam  Nursing note and vitals reviewed. Constitutional: He is oriented to person, place, and time. He appears well-developed and  well-nourished. He has a sickly appearance.  Up in bed drinking soda with ice.   HENT:  Head: Normocephalic and atraumatic.  Mouth/Throat: Oropharynx is clear and moist.  Eyes: Pupils are equal, round, and reactive to light. Conjunctivae are normal.  Neck: Normal range of motion. Neck supple.  #5 Trach in place with cork. Weak,hoarse voice.   Cardiovascular: Normal rate and regular rhythm.   Occasional extrasystoles are present. Exam reveals no friction rub.   No murmur heard. Respiratory: Effort normal. No stridor. No respiratory distress. He has decreased breath sounds. He has no wheezes.  GI: Soft. Bowel sounds are normal. He exhibits no distension. There is no tenderness.  PEG site clean and dry. Colostomy patent.  Genitourinary:  Genitourinary Comments: Foley in place. Inguinal area red and macerated appearing.    Musculoskeletal: He exhibits no edema or tenderness.  Neurological: He is alert and oriented to person, place, and time.  Anxious appearing male. Able to answer orientation question but overwhelmed easily. He is able to follow simple motor commands. UE motor 4/5 prox to distal. LE: 0/5 bilaterally throughout. Sensation diminished below mid trunk. Can sense general touch in legs but cannot sense pain. No resting tone. DTR's 1+ to 2+  Skin: Skin is warm and dry.  4 cm Macerated area around sacral decub. Rectal area with MASD and dried secretions.   Psychiatric: His mood appears anxious. His speech is rapid and/or pressured. He is agitated. Cognition and memory are impaired.  Easily agitated. Started hyperventilating at attempts to get medical hx--unable to recall fully.     Lab Results Last 48 Hours  No results found for this or any previous visit (from the past 48 hour(s)).   Imaging Results (Last 48 hours)  No results found.       Medical Problem List and Plan: 1.  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  DVT Prophylaxis/Anticoagulation: Pharmaceutical: Lovenox 3. Pain Management: Oxycodone prn 4. Mood: team to provide ego support. LCSW to follow for evaluation and support 5. Neuropsych: This patient is not fully capable of making decisions on his own behalf. 6. Skin/Wound Care: Wound VAC to sacral area--has completed antibiotic regimen for osteomyelitis. Boost every 30 minutes when in chair.  7. Fluids/Electrolytes/Nutrition: strict I/O. Continue protein supplements. Check lytes in am.  8. T2DM: Monitor BS ac/hs. Continue Lantus insulin with SSI for elevated BS.  9. Chronic combined systolic/diastolic CHF: Monitor for signs of overload. Check weight daily. Heart healthy diet. On hydralazine, losartan and imdur. Intolerant of statins.  10. Anxiety disorder: Continue buspar bid with Seroquel at nights. 11. Hypothyroid: On synthroid for supplement. Check TSH in am.  12. TBI: On modafinil for attention/activation?  13. ABLA: improving. Continue iron supplement.  14. Acute on chronic renal failure?:  Will add water flushes via PEG.  15. Intermittent leucocytosis: Monitor for signs of infection. Most recently treated for Kleb UTI. Will recheck culture for clearing.  16. Stage IV sacral decub: Air mattress overlay. WOC for input. Continue wound VAC. Add vitamin  to promote healing. Calorie count--may need tube feeds at nights as intake variable and has high nutritional needs. Albumin 2.1.  17. Intermittent abdominal pain: CT scan 7/23 negative for obst or inflammation.  96. Trach dependent: Encourage IS due to atelectasis. SOB likely psychogenic. Will check CXR as drinking thin liquids.              -think he has the capacity for decannulation. Consider next week depending upon how he does over the weekend. Trached capped currrently    Post Admission Physician Evaluation: 1. Functional deficits secondary  to T5 SCI/debility. 2. Patient is admitted to receive collaborative,  interdisciplinary care between the physiatrist, rehab nursing staff, and therapy team. 3. Patient's level of medical complexity and substantial therapy needs in context of that medical necessity cannot be provided at a lesser intensity of care such as a SNF. 4. Patient has experienced substantial functional loss from his/her baseline which was documented above under the "Functional History" and "Functional Status" headings.  Judging by the patient's diagnosis, physical exam, and functional history, the patient has potential for functional progress which will result in measurable gains while on inpatient rehab.  These gains will be of substantial and practical use upon discharge  in facilitating mobility and self-care at the household level. 5. Physiatrist will provide 24 hour management of medical needs as well as oversight of the therapy plan/treatment and provide guidance as appropriate regarding the interaction of the two. 6. The Preadmission Screening has been reviewed and patient status is unchanged unless otherwise stated above. 7. 24 hour rehab nursing will assist with bladder management, bowel management, safety, skin/wound care, disease management, medication administration, pain management and patient education  and help integrate therapy concepts, techniques,education, etc. 8. PT will assess and treat for/with: Lower extremity strength, range of motion, stamina, balance, functional mobility, safety, adaptive techniques and equipment, trunk control, w/c assessment, family .   Goals are: mod assist at w/c level. 9. OT will assess and treat for/with: ADL's, functional mobility, safety, upper extremity strength, adaptive techniques and equipment, NMR, family education, ego support.   Goals are: mod assist. Therapy may not yet proceed with showering this patient. 10. SLP will assess and treat for/with: speech, swalowing, communication.  Goals are: mod I. 11. Case Management and Social Worker will  assess and treat for psychological issues and discharge planning. 12. Team conference will be held weekly to assess progress toward goals and to determine barriers to discharge. 13. Patient will receive at least 3 hours of therapy per day at least 5 days per week. 14. ELOS: 22-28 days       15. Prognosis:  excellent     Meredith Staggers, MD, [facility] 09/11/2016  Bary Leriche, Hershal Coria 09/18/2016

## 2016-09-20 ENCOUNTER — Inpatient Hospital Stay (HOSPITAL_COMMUNITY): Payer: Medicare Other

## 2016-09-20 ENCOUNTER — Inpatient Hospital Stay (HOSPITAL_COMMUNITY): Payer: Medicare Other | Admitting: Speech Pathology

## 2016-09-20 ENCOUNTER — Inpatient Hospital Stay (HOSPITAL_COMMUNITY): Payer: Medicare Other | Admitting: *Deleted

## 2016-09-20 DIAGNOSIS — N179 Acute kidney failure, unspecified: Secondary | ICD-10-CM

## 2016-09-20 DIAGNOSIS — L89154 Pressure ulcer of sacral region, stage 4: Secondary | ICD-10-CM

## 2016-09-20 DIAGNOSIS — E46 Unspecified protein-calorie malnutrition: Secondary | ICD-10-CM

## 2016-09-20 DIAGNOSIS — E119 Type 2 diabetes mellitus without complications: Secondary | ICD-10-CM

## 2016-09-20 DIAGNOSIS — R339 Retention of urine, unspecified: Secondary | ICD-10-CM | POA: Insufficient documentation

## 2016-09-20 DIAGNOSIS — E876 Hypokalemia: Secondary | ICD-10-CM | POA: Insufficient documentation

## 2016-09-20 DIAGNOSIS — E039 Hypothyroidism, unspecified: Secondary | ICD-10-CM

## 2016-09-20 LAB — COMPREHENSIVE METABOLIC PANEL
ALBUMIN: 2.1 g/dL — AB (ref 3.5–5.0)
ALK PHOS: 88 U/L (ref 38–126)
ALT: 14 U/L — ABNORMAL LOW (ref 17–63)
ANION GAP: 8 (ref 5–15)
AST: 20 U/L (ref 15–41)
BILIRUBIN TOTAL: 0.2 mg/dL — AB (ref 0.3–1.2)
BUN: 42 mg/dL — ABNORMAL HIGH (ref 6–20)
CHLORIDE: 102 mmol/L (ref 101–111)
CO2: 29 mmol/L (ref 22–32)
Calcium: 9.1 mg/dL (ref 8.9–10.3)
Creatinine, Ser: 1.05 mg/dL (ref 0.61–1.24)
GFR calc Af Amer: >60 mL/min (ref >60)
Glucose, Bld: 116 mg/dL — ABNORMAL HIGH (ref 65–99)
POTASSIUM: 3.3 mmol/L — AB (ref 3.5–5.1)
Sodium: 139 mmol/L (ref 135–145)
Total Protein: 6 g/dL — ABNORMAL LOW (ref 6.5–8.1)

## 2016-09-20 LAB — GLUCOSE, CAPILLARY
Glucose-Capillary: 132 mg/dL — ABNORMAL HIGH (ref 65–99)
Glucose-Capillary: 145 mg/dL — ABNORMAL HIGH (ref 65–99)
Glucose-Capillary: 196 mg/dL — ABNORMAL HIGH (ref 65–99)
Glucose-Capillary: 218 mg/dL — ABNORMAL HIGH (ref 65–99)

## 2016-09-20 LAB — CBC WITH DIFFERENTIAL/PLATELET
BASOS PCT: 1 %
Basophils Absolute: 0.1 10*3/uL (ref 0.0–0.1)
Eosinophils Absolute: 1.7 10*3/uL — ABNORMAL HIGH (ref 0.0–0.7)
Eosinophils Relative: 16 %
HEMATOCRIT: 31.3 % — AB (ref 39.0–52.0)
HEMOGLOBIN: 9.6 g/dL — AB (ref 13.0–17.0)
LYMPHS ABS: 2.3 10*3/uL (ref 0.7–4.0)
LYMPHS PCT: 22 %
MCH: 25.9 pg — ABNORMAL LOW (ref 26.0–34.0)
MCHC: 30.7 g/dL (ref 30.0–36.0)
MCV: 84.6 fL (ref 78.0–100.0)
MONO ABS: 0.6 10*3/uL (ref 0.1–1.0)
MONOS PCT: 6 %
NEUTROS ABS: 5.8 10*3/uL (ref 1.7–7.7)
NEUTROS PCT: 55 %
Platelets: 374 10*3/uL (ref 150–400)
RBC: 3.7 MIL/uL — ABNORMAL LOW (ref 4.22–5.81)
RDW: 16.8 % — AB (ref 11.5–15.5)
WBC: 10.4 10*3/uL (ref 4.0–10.5)

## 2016-09-20 LAB — TSH: TSH: 6.665 u[IU]/mL — AB (ref 0.350–4.500)

## 2016-09-20 MED ORDER — CLONAZEPAM 0.5 MG PO TABS
0.2500 mg | ORAL_TABLET | Freq: Two times a day (BID) | ORAL | Status: DC | PRN
  Filled 2016-09-20: qty 1

## 2016-09-20 MED ORDER — ALPRAZOLAM 0.25 MG PO TABS
0.2500 mg | ORAL_TABLET | Freq: Two times a day (BID) | ORAL | Status: DC | PRN
Start: 2016-09-20 — End: 2016-09-22
  Administered 2016-09-20 – 2016-09-22 (×3): 0.25 mg via ORAL
  Filled 2016-09-20 (×3): qty 1

## 2016-09-20 MED ORDER — BOOST PLUS PO LIQD
237.0000 mL | Freq: Two times a day (BID) | ORAL | Status: DC
  Administered 2016-09-21 (×2): 237 mL via ORAL
  Filled 2016-09-20 (×8): qty 237

## 2016-09-20 MED ORDER — POTASSIUM CHLORIDE CRYS ER 20 MEQ PO TBCR
30.0000 meq | EXTENDED_RELEASE_TABLET | Freq: Three times a day (TID) | ORAL | Status: AC
  Administered 2016-09-20 – 2016-09-21 (×3): 30 meq via ORAL
  Filled 2016-09-20 (×3): qty 1

## 2016-09-20 NOTE — Progress Notes (Signed)
Speech Language Pathology Note  Patient Details  Name: Gregory Hester MRN: [id number] Date of Birth: 1945/10/16 Today's Date: 09/20/2016  SLP received orders for speaking valve evaluation; however, pt's trach has been capped.  Will discontinue orders but please re-consult if necessary to evaluate for orders for usage.     Leana [place], Selinda Orion 09/20/2016, 4:54 PM

## 2016-09-20 NOTE — Evaluation (Signed)
Speech Language Pathology Assessment and Plan  Patient Details  Name: Gregory Hester MRN: [id number] Date of Birth: 1946-02-06  SLP Diagnosis: Voice disorder;Dysphagia;Cognitive Impairments  Rehab Potential: Good ELOS: 21 days     Today's Date: 09/20/2016 SLP Individual Time: 1340-1410 SLP Individual Time Calculation (min): 30 min   Problem List:  Patient Active Problem List   Diagnosis Date Noted  . Hypoalbuminemia due to protein-calorie malnutrition ([place])   . Hypokalemia   . Urinary retention   . Decubitus ulcer of sacral region, stage 4 ([place])   . AKI (acute kidney injury) ([place])   . Hypothyroidism   . Diabetes mellitus type 2 in nonobese ([facility])   . Spinal cord injury, T1-T6 ([place]) 08/25/2016  . Neurogenic bowel 09/20/2016  . Neurogenic bladder 08/31/2016  . Tracheostomy status ([place]) 09/10/2016  . Colostomy status ([place]) 09/13/2016   Past Medical History:  Past Medical History:  Diagnosis Date  . Bursitis of hip, right   . Chronic combined systolic (congestive) and diastolic (congestive) heart failure ([place])   . Diabetes mellitus ([place])   . HTN (hypertension)   . Hypospadias   . Hypothyroid   . Osteomyelitis of sacrum ([place])   . Sacral decubitus ulcer, stage IV ([place])   . Thrombocytosis ([place])    Past Surgical History:  Past Surgical History:  Procedure Laterality Date  . BACK SURGERY  2015  . CERVICAL SPINE SURGERY    . CORONARY ARTERY BYPASS GRAFT  2014  . HERNIA REPAIR    . TOTAL HIP ARTHROPLASTY Right   . TOTAL HIP ARTHROPLASTY  left    Assessment / Plan / Recommendation Clinical Impression  Gregory Hester is a 71 year old male originally admitted to [facility] on 04/11/16 after fall off aladder while working.  [employer] with incomplete T5-T6 paraplegia, multiple skull fractures, hangman's fracture and right distal radius fracture. He underwentT2-T6 posterior decompression, ORIF right distal radius fracture, tracheostomy-XLT and was discharged to [place] for vent  wean on 05/12/16. He was extubated but trach dependent and sent to [facility] in [place] for 22 hours whereupon he was readmitted via ER to [facility] center for a month. He was treated for Aspiration pneumonitis(pea taken out with suctioning) with respiratory failure. Hospital course significant for worsening of dysphagia, stage IV sacral decub with osteomyelitis and cultures positive for VRE and pseudomonas. Palliative care consulted and patient elected on full medical treatment and he was transferred to [facility] on 08/23/16 as vent dependent at nights. CT head done due to headaches and showed moderate atrophy and chronic microvascular disease. Trach downsized on 7/22 and he is tolerating capping trials during the day since 7/24--no plans to decannulate at this time per pulmonary. He has been tolerating dysphagia 3, nectar diet with po intake at 25- 75%. Pt admitted to CIR and SLP evaluation completed on 09/20/2016 with the following results:  Pt presents with s/s of a mild-moderate pharyngeal dysphagia characterized by suspected delayed swallow initiation and multiple swallows of both thickened liquids and ice chips.  Trials of ice chips resulted in prolonged, weak coughing episode which was mitigated with nectar thick liquids.  No overt s/s of aspiration with solids and swallow appeared more timely with no instances of multiple swallows.  For now, recommend that pt remain on his current diet with trials of ice chips with SLP to continue working towards repeat objective assessment; favor FEES to allow visualization of vocal folds given pt's cough strength and decreased vocal intensity.   Pt also presents  with impaired intelligibility at the conversational level resulting from poor breath support for speech with concomitant decreased vocal intensity.  Pt also demonstrates mild-moderate cognitive impairments with impaired recall of new information and decreased functional problem solving.  Anxiety appears to be  limiting in all aspects of pt's care.   As a result, pt would benefit from skilled ST while inpatient in order to maximize functional independence and reduce burden of care prior to discharge.  Anticipate that pt will need 24/7 supervision at discharge in addition to [place] follow up at next level of care.    Skilled Therapeutic Interventions          Cognitive-linguistic and bedside swallow evaluation completed with results and recommendations reviewed with family.   Session began late as pt was with RN for dressing change upon therapist's arrival and then needed deep suctioning from respiratory therapy afterwards due to desats into the 70s with bed mobility.     SLP Assessment  Patient will need skilled Speech Lanaguage Pathology Services during CIR admission    Recommendations  SLP Diet Recommendations: Dysphagia 3 (Mech soft);Nectar Liquid Administration via: Cup Medication Administration: Whole meds with liquid Supervision: Patient able to self feed;Full supervision/cueing for compensatory strategies Compensations: Minimize environmental distractions;Slow rate;Small sips/bites Postural Changes and/or Swallow Maneuvers: Out of bed for meals;Seated upright 90 degrees Oral Care Recommendations: Oral care BID Recommendations for Other Services: Neuropsych consult Patient destination: Home Follow up Recommendations: 24 hour supervision/assistance;Home Health SLP Equipment Recommended: None recommended by SLP    SLP Frequency 3 to 5 out of 7 days   SLP Duration  SLP Intensity  SLP Treatment/Interventions 21 days   Minumum of 1-2 x/day, 30 to 90 minutes  Cognitive remediation/compensation;Cueing hierarchy;Dysphagia/aspiration precaution training;Environmental controls;Functional tasks;Internal/external aids;Speech/Language facilitation;Patient/family education    Pain Pain Assessment Pain Assessment: No/denies pain  Prior Functioning Cognitive/Linguistic Baseline: Within functional  limits Type of Home: Apartment  Lives With: Spouse Available Help at Discharge: Family;Available 24 hours/day Vocation: Full time employment  Function:  Eating Eating   Modified Consistency Diet: Yes Eating Assist Level: Supervision or verbal cues           Cognition Comprehension Comprehension assist level: Follows basic conversation/direction with extra time/assistive device  Expression   Expression assist level: Expresses basic 75 - 89% of the time/requires cueing 10 - 24% of the time. Needs helper to occlude trach/needs to repeat words.  Social Interaction Social Interaction assist level: Interacts appropriately 75 - 89% of the time - Needs redirection for appropriate language or to initiate interaction.  Problem Solving Problem solving assist level: Solves basic 75 - 89% of the time/requires cueing 10 - 24% of the time  Memory Memory assist level: Recognizes or recalls 50 - 74% of the time/requires cueing 25 - 49% of the time   Short Term Goals: Week 1: SLP Short Term Goal 1 (Week 1): Pt will consume therapeutic trials of ice chips with minimal overt s/s of aspiration and mod I use of swallowing precautions over 3 consecutive sessions prior to repeat objective assessment.   SLP Short Term Goal 2 (Week 1): Pt will consume dys 3 textures and nectar thick liquids with mod I use of swallowing precautions and minimal overt s/s of aspiration.   SLP Short Term Goal 3 (Week 1): Pt will utilize external aids to recall daily information with min assist verbal cues.  SLP Short Term Goal 4 (Week 1): Pt will complete basic, familiar tasks with min assist for functional problem solving.  SLP Short Term Goal 5 (Week 1): Pt will increase vocal intensity to achieve intelligibility during conversations in noisy environments with supervision verbal cues.    Refer to Care Plan for Long Term Goals  Recommendations for other services: Neuropsych  Discharge Criteria: Patient will be discharged  from SLP if patient refuses treatment 3 consecutive times without medical reason, if treatment goals not met, if there is a change in medical status, if patient makes no progress towards goals or if patient is discharged from hospital.  The above assessment, treatment plan, treatment alternatives and goals were discussed and mutually agreed upon: by patient and by family  Emilio Math 09/20/2016, 4:37 PM

## 2016-09-20 NOTE — Progress Notes (Signed)
Initial Nutrition Assessment  DOCUMENTATION CODES:   Severe malnutrition in context of chronic illness  INTERVENTION:   -Calorie Count ordered. Assess on follow and provide further recommendations. Pt does have G-tube in place (being flushed with water to maintain patency). Consider initiation of supplemental EN via G-tube if intake inadequate  -Continue Pro-Stat BID  -Ensure Enlive po BID, each supplement provides 350 kcal and 20 grams of protein  -Magic cup TID with meals, each supplement provides 290 kcal and 9 grams of protein  Continue MVI  Recommend removal of Heart Healthy, Carb Modified restriction to current diet order   NUTRITION DIAGNOSIS:   Malnutrition (Severe) related to chronic illness as evidenced by severe depletion of body fat, severe depletion of muscle mass, percent weight loss.  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 He underwentT2-T6 posterior decompression, ORIF right distal radius fracture, tracheostomy-XLT and was discharged to [place] for vent wean on 05/12/16. He was extubated but trach dependent and sent to [facility] in [place] for 22 hours whereupon he was readmitted via ER to [facility] center for a month. He was treated for Aspiration pneumonitis(pea taken out with suctioning) with respiratory failure. Hospital course significant for worsening of dysphagia, stage IV sacral decub with osteomyelitis and cultures positive for VRE and pseudomonas. Palliative care consulted and patient elected on full medical treatment and he was transferred to [facility]  on 08/23/16 as vent dependent at nights. Pt admitted to Rehab with paraplegia and functional deficits   Pt very anxious on visit today.   Recorded po intake 60-80% of meals. Noted order for Prostat BID as well. Calorie count has been ordered. 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 Pt has a G-tube in place but has not used for tube feeding in several weeks. Indicates no wt loss since stopping TF. Wife reports she was surprised that that stopped his nighttime feedings as pt was not eating well at the time (pt eating mostly bites and sips and wife finishing meal tray)  Feb 2018 of this year, pt weighed 195 pounds. Current wt 156 pounds (20% wt loss in <6 months which is significant for time frame)  Nutrition-Focused physical exam completed. Findings are severe fat depletion, severe muscle depletion, and no edema.   Labs: potassium 3.3 Meds: ss novolog, lantus, MVI/minerals, KCl  Diet Order:  DIET DYS 3 Room service appropriate? Yes; Fluid consistency: Nectar Thick  Skin:  Wound (see comment) (stage IV sacrum)  Last BM:  7/28 diverting colostomy  Height:   Ht Readings from Last 1 Encounters:  09/11/2016 5' 10.5" (1.791 m)    Weight:   Wt Readings from Last 1 Encounters:  09/20/16 156 lb 1.4 oz (70.8 kg)    Ideal Body Weight:     BMI:  Body mass index is 22.08 kg/m.  Estimated Nutritional Needs:   Kcal:  2130-2400 kcals   Protein:  107-142 g   Fluid:  >/= 2 L  EDUCATION NEEDS:   No education needs identified at this time  Kerman Passey  MS, RD, LDN [phone] Pager  [phone] Weekend/On-Call Pager

## 2016-09-20 NOTE — Evaluation (Signed)
Physical Therapy Assessment and Plan  Patient Details  Name: Gregory Hester  MRN: [id number] Date of Birth: 11/24/1945  PT Diagnosis: Abnormal posture, Impaired sensation, Muscle weakness and Paraplegia Rehab Potential: Good ELOS: 3 weeks   Today's Date: 09/20/2016 PT Individual Time: 0800-0920 PT Individual Time Calculation (min): 80 min    Problem List:  Patient Active Problem List   Diagnosis Date Noted  . Hypoalbuminemia due to protein-calorie malnutrition ([place])   . Hypokalemia   . Urinary retention   . Decubitus ulcer of sacral region, stage 4 ([place])   . AKI (acute kidney injury) ([place])   . Hypothyroidism   . Diabetes mellitus type 2 in nonobese (H[facility])   . Spinal cord injury, T1-T6 ([place]) 09/23/2016  . Neurogenic bowel 08/29/2016  . Neurogenic bladder 09/09/2016  . Tracheostomy status ([place]) 08/30/2016  . Colostomy status ([place]) 09/22/2016    Past Medical History:  Past Medical History:  Diagnosis Date  . Bursitis of hip, right   . Chronic combined systolic (congestive) and diastolic (congestive) heart failure ([place])   . Diabetes mellitus ([place])   . HTN (hypertension)   . Hypospadias   . Hypothyroid   . Osteomyelitis of sacrum ([place])   . Sacral decubitus ulcer, stage IV ([place])   . Thrombocytosis ([place])    Past Surgical History:  Past Surgical History:  Procedure Laterality Date  . BACK SURGERY  2015  . CERVICAL SPINE SURGERY    . CORONARY ARTERY BYPASS GRAFT  2014  . HERNIA REPAIR    . TOTAL HIP ARTHROPLASTY Right   . TOTAL HIP ARTHROPLASTY  left    Assessment & Plan Clinical Impression: Gregory Hester is a 71 year old male originally admitted to [facility] on 04/11/16 after fall off a ladder while working with SCI with incomplete T5-T6 paraplegia, multiple skull fractures, hangman's fracture and right distal radius fracture.  He underwentT2-T6 posterior decompression, ORIF right distal radius fracture, tracheostomy-XLT and was discharged to [place] for vent wean  on 05/12/16. He was extubated but trach dependent and sent to [facility] in [place] for 22 hours whereupon he was readmitted via ER to [facility] center for a month. He was treated for Aspiration pneumonitis(pea taken out with suctioning) with respiratory failure. Hospital course significant for worsening of dysphagia, stage IV sacral decub with osteomyelitis and cultures positive for VRE and pseudomonas. Palliative care consulted and patient elected on full medical treatment and he was transferred to [facility] on 08/23/16 as vent dependent at nights.   CT head done due to headaches and showed moderate atrophy and chronic microvascular disease. Wound treated with daptomycin thorough 7/23 and meropenum added due to ESBL Klebsiella UTI. Trach downsized on 7/22 and he is tolerating capping trials during the day since 7/24--no plans to decannulate at this time per pulmonary. He has been tolerating dysphagia 3, nectar diet with po intake at 25- 75%. .  Medications being added to help with chronic systolic CHF and elevated blood pressures.  Blood sugars have been trending upwards and lantus dose being adjusted. Sacral decub decrease in size and now 10 X 8.5 cm with 1 cm depth. Patient showing improvement in activity tolerance and .  Patient transferred to CIR on 09/23/2016 .   Patient currently requires total with mobility secondary to muscle weakness, muscle joint tightness and muscle paralysis, decreased cardiorespiratoy endurance, decreased coordination and decreased sitting balance, decreased postural control and decreased balance strategies.  Prior to hospitalization, patient was independent  with mobility and lived  with Spouse in a Apartment home.  Home access is 2 steps at side with plans to build a ramp.Stairs to enter.  Patient will benefit from skilled PT intervention to maximize safe functional mobility, minimize fall risk and decrease caregiver burden for planned discharge home with 24 hour assist.   Anticipate patient will benefit from follow up [facility] at discharge.  PT - End of Session Activity Tolerance: Tolerates < 10 min activity, no significant change in vital signs Endurance Deficit: Yes Endurance Deficit Description: Pt unable to tolerate sitting EOB >8 min or OOB to WC >30 min. VSS on RA PT Assessment Rehab Potential (ACUTE/IP ONLY): Good PT Barriers to Discharge: [place] home environment;Medical stability;Home environment access/layout;Trach;Neurogenic Bowel & Bladder;Wound Care PT Barriers to Discharge Comments: Home modification and significant caregiver burden PT Patient demonstrates impairments in the following area(s): Balance;Endurance;Motor;Pain;Nutrition;Safety;Sensory;Skin Integrity PT Transfers Functional Problem(s): Bed Mobility;Bed to Chair;Car PT Locomotion Functional Problem(s): Wheelchair Mobility PT Plan PT Intensity: Minimum of 1-2 x/day ,45 to 90 minutes PT Frequency: 5 out of 7 days PT Duration Estimated Length of Stay: 3 weeks PT Treatment/Interventions: Balance/vestibular training;Cognitive remediation/compensation;Community reintegration;Discharge planning;Disease management/prevention;DME/adaptive equipment instruction;Functional electrical stimulation;Functional mobility training;Neuromuscular re-education;Pain management;Patient/family education;Psychosocial support;Skin care/wound management;Splinting/orthotics;Therapeutic Activities;Therapeutic Exercise;UE/LE Strength taining/ROM;UE/LE Coordination activities;Wheelchair propulsion/positioning PT Transfers Anticipated Outcome(s): Mod A Sliding Board PT Locomotion Anticipated Outcome(s): WC x150' with S  PT Recommendation Recommendations for Other Services: Neuropsych consult;Therapeutic Recreation consult Therapeutic Recreation Interventions: Pet therapy;Kitchen group;Stress management;Outing/community reintergration Follow Up Recommendations: Home health PT;24 hour supervision/assistance Patient  destination: Home Equipment Recommended: Sliding board;Wheelchair (measurements);Wheelchair cushion (measurements) Equipment Details: 18x18 with pressure relief cushipn  Skilled Therapeutic Intervention Pt tx initiated upon eval. Pt oriented to unit, call bell, PT POC, and principles of rehab and SCI recovery. Pt was educated on fall risk reduction and precautions. Pt instructed in rolling, bed mobility, and EOB sitting with full trunk support of PT posteriorly x8 min. Pt with increased anxiety and posterior pushing, but VSS throughout on RA, trach capped. Supine therex for PROM and stretching performed to maintain functional ROM at all joint planes x1 min each. Pt instructed in transfer training for lift via [place] to [facility] with pressure-relief cushion. PT encouraged to remain OOB as much as possible during the day. Wife arrived at end of eval/tx and was very encouraging. Pt lef tup in [facility] with all needs in reach.   PT Evaluation Precautions/Restrictions Precautions Precautions: Fall;Other (comment) Precaution Comments: paraplegia, Colostomy, PEG, Sacral wound vac,  Restrictions Weight Bearing Restrictions: No General   Vital SignsTherapy Vitals Pulse Rate: 81 Resp: 18 Oxygen Therapy SpO2: 93 % O2 Device: Not Delivered Pain Pain Assessment Pain Assessment: 0-10 Pain Score: 9  Pain Type: Acute pain Pain Location: Head Pain Radiating Towards: back Pain Descriptors / Indicators: Aching Pain Frequency: Intermittent Pain Onset: Gradual Pain Intervention(s): Medication (See eMAR) Home Living/Prior Functioning Home Living Available Help at Discharge: Family;Available 24 hours/day Type of Home: Apartment Home Access: Stairs to enter Entrance Stairs-Number of Steps: 2 steps at side with plans to build a ramp. Home Layout: One level  Lives With: Spouse Prior Function Level of Independence: Independent with basic ADLs;Independent with gait;Independent with transfers  Able to Take  Stairs?: Reciprically Driving: Yes Vocation: Full time employment Vision/Perception     Cognition Overall Cognitive Status: Impaired/Different from baseline Arousal/Alertness: Awake/alert Orientation Level: Oriented X4 Memory:  (Pt realizes likley incorrect, but thinks he has walked since) Awareness: Appears intact Problem Solving: Impaired Problem Solving Impairment: Functional basic Safety/Judgment: Appears intact Comments:  Problem solving for new functional situations limited Sensation Sensation Light Touch: Impaired Detail Light Touch Impaired Details: Impaired RLE;Impaired LLE Proprioception: Impaired Detail Proprioception Impaired Details: Impaired RLE;Impaired LLE Additional Comments: Pt 75% accurate with light touch on bil LEs, but 25% accurate proprioception at toes, ankles WNL Coordination Gross Motor Movements are Fluid and Coordinated: No Fine Motor Movements are Fluid and Coordinated: No Coordination and Movement Description: No motor coordination at bil LEs consistent with paraplegia Heel Shin Test: Unable Motor  Motor Motor: Paraplegia Motor - Skilled Clinical Observations: 0/5 strength throughout bil LEs and trunk to waistline  Mobility Bed Mobility Bed Mobility: Rolling Left;Rolling Right;Right Sidelying to Sit;Sitting - Scoot to [employer] of Bed;Sit to Supine;Scooting to [facility] Rolling Right: 2: Max assist Rolling Right Details (indicate cue type and reason): Overall, pt is able to assit with UEs during bed mobility for rolling and reaching,, but he needs step-by-step sequence and technique cues. Pt is unable ot assist iwth trunk or LEs for transitional movements.  Rolling Left: 2: Max assist Right Sidelying to Sit: 1: +2 Total assist Right Sidelying to Sit: Patient Percentage: 10% Sitting - Scoot to Edge of Bed: 1: +1 Total assist Sit to Supine: 1: +2 Total assist Sit to Supine: Patient Percentage: 10% Scooting to HOB: 1: +2 Total assist Scooting to [facility]: Patient  Percentage: 0% Transfers Transfers: Yes Transfer via Lift Equipment: Maximove Locomotion  Ambulation Ambulation: No Gait Gait: No Stairs / Additional Locomotion Stairs: No Wheelchair Mobility Wheelchair Mobility: No  Trunk/Postural Assessment  Cervical Assessment Cervical Assessment: Exceptions to [facility] Cervical Strength Overall Cervical Strength Comments: Pt with forward head posture, unable to rest head in neutral position with flat HOB. ROM limited by capped trach as well.  Thoracic Assessment Thoracic Assessment: Exceptions to [facility] Thoracic Strength Overall Thoracic Strength Comments: Generalized weakness consistent with incomplete T5-T6 paraplegia Lumbar Assessment Lumbar Assessment: Exceptions to [facility] Lumbar Strength Overall Lumbar Strength Comments: Generalized weakness Postural Control Postural Control: Deficits on evaluation Head Control: Poor endurance contribrutes to downward head/neck positioning Trunk Control: Poor trunk contro, needed Max A for sitting balance EOB Righting Reactions: Delayed and insufficient, reliance on UEs for control  Balance Balance Balance Assessed: Yes Static Sitting Balance Static Sitting - Balance Support: Right upper extremity supported;Left upper extremity supported;Feet supported Static Sitting - Level of Assistance: 2: Max assist Static Sitting - Comment/# of Minutes: 8 min with PT supporting trunk from behind for optimal extention Dynamic Sitting Balance Dynamic Sitting - Balance Support: Right upper extremity supported;Left upper extremity supported;Feet supported;During functional activity Dynamic Sitting - Level of Assistance: 2: Max assist Dynamic Sitting Balance - Compensations: UE reliance and head righting attempted Dynamic Sitting - Balance Activities: Lateral lean/weight shifting;Forward lean/weight shifting Sitting balance - Comments: 2 min with great effort Extremity Assessment  RUE Assessment RUE Assessment: Exceptions  to [facility] RUE Strength RUE Overall Strength Comments: Generalized weakness 3/5 throughout; TBA further LUE Assessment LUE Assessment: Exceptions to [facility] LUE Strength LUE Overall Strength Comments: Generalized weakness 3/5 throughout; TBA further RLE Assessment RLE Assessment: Exceptions to [facility] RLE PROM (degrees) RLE Overall PROM Comments: WFL except HS tightness limiting knee ext  RLE Strength RLE Overall Strength Comments: 0/5 throughout LLE Assessment LLE Assessment: Exceptions to WFL LLE PROM (degrees) LLE Overall PROM Comments: WFL except HS tightness limiting knee ext  LLE Strength LLE Overall Strength Comments: 0/5   See Function Navigator for Current Functional Status.   Refer to Care Plan for Long Term Goals  Recommendations for other services:  Neuropsych and Therapeutic Recreation  Pet therapy, Kitchen group, Stress management and Outing/community reintegration  Discharge Criteria: Patient will be discharged from PT if patient refuses treatment 3 consecutive times without medical reason, if treatment goals not met, if there is a change in medical status, if patient makes no progress towards goals or if patient is discharged from hospital.  The above assessment, treatment plan, treatment alternatives and goals were discussed and mutually agreed upon: by patient and by family  Dali Kraner, Corinna Lines, PT, DPT  09/20/2016, 11:08 AM

## 2016-09-20 NOTE — Progress Notes (Signed)
[facility] PHYSICAL MEDICINE & REHABILITATION     PROGRESS NOTE  Subjective/Complaints:  Pt seen laying in bed this AM.  He slept well overnight and is ready to being therapies.   ROS: Denies CP, SOB, N/V/D.  Objective: Vital Signs: Blood pressure (!) 131/95, pulse 81, temperature 97.9 F (36.6 C), temperature source Oral, resp. rate 18, height 5' 10.5" (1.791 m), weight 70.8 kg (156 lb 1.4 oz), SpO2 93 %. Dg Chest 2 View  Result Date: 09/17/2016 CLINICAL DATA:  Patient with fall from ladder on 04/11/2016, paraplegia. Respiratory failure. Pneumonia. EXAM: CHEST  2 VIEW COMPARISON:  Chest radiograph 09/07/2016. FINDINGS: Limited rotated portable exam. Tracheostomy tube projects over the mid trachea. Stable enlarged cardiac and mediastinal contours status post median sternotomy. Unchanged superior fractured sternotomy wires. Heterogeneous opacities left lung base. Probable small left pleural effusion. Upper thoracic spine fusion hardware. Percutaneous gastrostomy tube. IMPRESSION: Limited rotated portable exam. Probable small left pleural effusion and underlying opacities which may represent atelectasis or infection. Electronically Signed   By: Lovey Newcomer M.D.   On: 09/16/2016 18:31    Recent Labs  09/20/16 0510  WBC 10.4  HGB 9.6*  HCT 31.3*  PLT 374    Recent Labs  09/20/16 0510  NA 139  K 3.3*  CL 102  GLUCOSE 116*  BUN 42*  CREATININE 1.05  CALCIUM 9.1   CBG (last 3)   Recent Labs  08/25/2016 1702 09/04/2016 2152 09/20/16 0718  GLUCAP 175* 181* 132*    Wt Readings from Last 3 Encounters:  09/20/16 70.8 kg (156 lb 1.4 oz)  09/17/16 66.7 kg (147 lb)    Physical Exam:  BP (!) 131/95 (BP Location: Right Arm)   Pulse 81   Temp 97.9 F (36.6 C) (Oral)   Resp 18   Ht 5' 10.5" (1.791 m)   Wt 70.8 kg (156 lb 1.4 oz)   SpO2 93%   BMI 22.08 kg/m  Constitutional: He appears well-developedand well-nourished. NAD HENT: Normocephalicand atraumatic.  Eyes: EOMI. No  discharge. Neck: #5 Trach in place with cork.  Cardiovascular: Normal rateand regular rhythm. No JVD. Respiratory: Effort normal. No stridor.  GI: Soft. Bowel sounds are normal. +PEG. Colostomy patent. Genitourinary: Foley in place.  Musculoskeletal: He exhibits no edemaor tenderness.  Neurological: He is alert.  Anxious appearing male.  He is able to follow simple motor commands.  Motor: B/l UE 4/5 prox to distal.  B/l LE: 0/5 bilaterally throughout.  Sensation intact to light touch throughout Skin: Skin is warmand dry.  Sacral decub.  Psychiatric: His mood appears anxious. His speech is rapid and/or pressured. He is agitated. Cognition and memory are impaired.   Assessment/Plan: 1. 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 Physiatrist is providing close team supervision and 24 hour management of active medical problems listed below. Physiatrist and rehab team continue to assess barriers to discharge/monitor patient progress toward functional and medical goals.  Function:  Bathing Bathing position      Bathing parts      Bathing assist        Upper Body Dressing/Undressing Upper body dressing                    Upper body assist        Lower Body Dressing/Undressing Lower body dressing  Lower body assist        Toileting Toileting Toileting activity did not occur: Safety/medical concerns        Toileting assist     Transfers Chair/bed Clinical biochemist          Cognition Comprehension Comprehension assist level: Understands basic 75 - 89% of the time/ requires cueing 10 - 24% of the time  Expression Expression assist level: Expresses basic 90% of the time/requires cueing < 10% of the time.  Social Interaction Social Interaction assist level:  Interacts appropriately with others with medication or extra time (anti-anxiety, antidepressant).  Problem Solving Problem solving assist level: Solves basic problems with no assist  Memory Memory assist level: Recognizes or recalls 75 - 89% of the time/requires cueing 10 - 24% of the time    Medical Problem List and Plan: 1. Paraplegia and functional deficits secondary to T5 SCI  Begin CIR  Notes reviewed 2. DVT Prophylaxis/Anticoagulation: Pharmaceutical: Lovenox 3. Pain Management: Oxycodone prn 4. Mood: team to provide ego support. LCSW to follow for evaluation and support 5. Neuropsych: This patient is not fully capable of making decisions on hisown behalf. 6. Skin/Wound Care: Wound VAC to sacral area--has completed antibiotic regimen for osteomyelitis. Boost every 15 minutes when in chair.  7. Fluids/Electrolytes/Nutrition: strict I/O. Continue protein supplements.  8. T2DM: Monitor BS ac/hs. Continue Lantus insulin with SSI for elevated BS.   Monitor with increased mobility 9. Chronic combined systolic/diastolic CHF: Monitor for signs of overload. Check weight daily. Heart healthy diet. On hydralazine, losartan and imdur. Intolerant of statins.  Filed Weights   09/15/2016 1526 09/20/16 0437  Weight: 70.8 kg (156 lb) 70.8 kg (156 lb 1.4 oz)  10. Anxiety disorder: Continue buspar bid with Seroquel at nights. 11. Hypothyroid: On synthroid for supplement.   TSH elevated on 7/28, will consider increase in meds 12. TBI: On modafinil for attention/activation?  13. ABLA: improving. Continue iron supplement.   Hb 9.6 on 7/28  Cont to monitor 14. Acute on chronic renal failure?: Added water flushes via PEG.   Cr. 1.05 on 7/28  Cont to monitor 15. Intermittent leucocytosis: Monitor for signs of infection. Most recently treated for Kleb UTI.   Recheck culture for clearing pending.  16. Stage IV sacral decub: Air mattress overlay. WOC for input. Continue wound VAC. Added vitamin to  promote healing. Calorie count--may need tube feeds at nights as intake variable and has high nutritional needs.  17. Intermittent abdominal pain: CT scan 7/23 negative for obst or inflammation.  26. Trach dependent: Encourage IS due to atelectasis. SOB likely psychogenic.   CXR reviewed, with likely atelectasis, cont to monitor 19. Urinary retention  Cont Foley for wound 20. Hypokalemia  K+ 3.3 on 7/28  Supplemented x1 day  Cont to monitor 21. Hypoalbuminemia  Supplement initiated 7/28   LOS (Days) 1 A FACE TO FACE EVALUATION WAS PERFORMED  Ankit Lorie Phenix 09/20/2016 9:51 AM

## 2016-09-20 NOTE — Evaluation (Signed)
Occupational Therapy Assessment and Plan  Patient Details  Name: Gregory Hester MRN: [id number] Date of Birth: 01/02/1946  OT Diagnosis: abnormal posture, cognitive deficits, muscle weakness (generalized) and paraplegia at level T5-6  Rehab Potential:   ELOS: 21-25 days   Today's Date: 09/20/2016 OT Individual Time: 1100-1200 OT Individual Time Calculation (min): 60 min     Problem List:  Patient Active Problem List   Diagnosis Date Noted  . Hypoalbuminemia due to protein-calorie malnutrition ([place])   . Hypokalemia   . Urinary retention   . Decubitus ulcer of sacral region, stage 4 ([place])   . AKI (acute kidney injury) ([place])   . Hypothyroidism   . Diabetes mellitus type 2 in nonobese ([facility])   . Spinal cord injury, T1-T6 ([place]) 09/20/2016  . Neurogenic bowel 08/26/2016  . Neurogenic bladder 09/18/2016  . Tracheostomy status ([place]) 09/20/2016  . Colostomy status ([place]) 09/14/2016    Past Medical History:  Past Medical History:  Diagnosis Date  . Bursitis of hip, right   . Chronic combined systolic (congestive) and diastolic (congestive) heart failure ([place])   . Diabetes mellitus ([place])   . HTN (hypertension)   . Hypospadias   . Hypothyroid   . Osteomyelitis of sacrum ([place])   . Sacral decubitus ulcer, stage IV ([place])   . Thrombocytosis ([place])    Past Surgical History:  Past Surgical History:  Procedure Laterality Date  . BACK SURGERY  2015  . CERVICAL SPINE SURGERY    . CORONARY ARTERY BYPASS GRAFT  2014  . HERNIA REPAIR    . TOTAL HIP ARTHROPLASTY Right   . TOTAL HIP ARTHROPLASTY  left    Assessment & Plan Clinical Impression: Gregory Hester is a 71 year old male originally admitted to [facility] on 04/11/16 after fall off aladder while working with SCI with incomplete T5-T6 paraplegia, multiple skull fractures, hangman's fracture and right distal radius fracture. He underwentT2-T6 posterior decompression, ORIF right distal radius fracture, tracheostomy-XLT and was discharged to  [place] for vent wean on 05/12/16. He was extubated but trach dependent and sent to [facility] in [place] for 22 hours whereupon he was readmitted via ER to [facility] center for a month. He was treated for Aspiration pneumonitis(pea taken out with suctioning) with respiratory failure. Hospital course significant for worsening of dysphagia, stage IV sacral decub with osteomyelitis and cultures positive for VRE and pseudomonas. Palliative care consulted and patient elected on full medical treatment and he was transferred to [facility] on 08/23/16 as vent dependent at nights.   CT head done due to headaches and showed moderate atrophy and chronic microvascular disease. Wound treated with daptomycin thorough 7/23 and meropenum added due to ESBL Klebsiella UTI. Trach downsized on 7/22 and he is tolerating capping trials during the day since 7/24--no plans to decannulate at this time per pulmonary. He has been tolerating dysphagia 3, nectar diet with po intake at 25- 75%. . Medications being added to help with chronic systolic CHF and elevated blood pressures. Blood sugars have been trending upwards and lantus dose being adjusted. Sacral decub decrease in size and now 10 X 8.5 cm with 1 cm depth. Patient showing improvement in activity tolerance  .    Patient currently requires max with basic self-care skills secondary to muscle weakness and muscle paralysis, decreased cardiorespiratoy endurance and decreased problem solving, decreased safety awareness and decreased memory.  Prior to hospitalization, patient could complete ADLs with independent .  Patient will benefit from skilled intervention to decrease level  of assist with basic self-care skills prior to discharge home with care partner.  Anticipate patient will require moderate physical assestance and follow up home health.  OT - End of Session Endurance Deficit: Yes Endurance Deficit Description: Pt unable to tolerate sitting EOB >8 min or OOB  to WC >30 min. VSS on RA OT Assessment OT Barriers to Discharge: Inaccessible home environment;Medical stability;Trach;Wound Care OT Patient demonstrates impairments in the following area(s): Balance;Cognition;Endurance;Motor;Pain;Safety;Sensory;Skin Integrity OT Basic ADL's Functional Problem(s): Grooming;Bathing;Dressing;Toileting OT Transfers Functional Problem(s): Toilet OT Additional Impairment(s): None OT Plan OT Intensity: Minimum of 1-2 x/day, 45 to 90 minutes OT Frequency: 5 out of 7 days OT Duration/Estimated Length of Stay: 21-25 days OT Treatment/Interventions: Balance/vestibular training;Cognitive remediation/compensation;Discharge planning;DME/adaptive equipment instruction;Functional mobility training;Pain management;Psychosocial support;Patient/family education;Self Care/advanced ADL retraining;Skin care/wound managment;Therapeutic Activities;Therapeutic Exercise;UE/LE Strength taining/ROM;UE/LE Coordination activities;Wheelchair propulsion/positioning OT Self Feeding Anticipated Outcome(s): S OT Basic Self-Care Anticipated Outcome(s): MOD A OT Toileting Anticipated Outcome(s): MAX A toileting; MOD A bathing OT Bathroom Transfers Anticipated Outcome(s): MOD A toilet OT Recommendation Patient destination: Home Follow Up Recommendations: Home health OT Equipment Recommended: To be determined (DAC;TTB)   Skilled Therapeutic Intervention 1:1. Pt wife present throughout session. Pt without reports of pain. RN alerted OT wound vac came off of wound and requested refraining from rolling/OOB activity 2/2 increased shearing on wound. Pt requires increased time and rest breaks throughout session due to increased fatigue. Pt educated on role/pupose of OT, CIR, ELOS, and POC. Pt washes UB with supervision and LB with A to bring LE into figure 4 in long sitting for OT to wash B feet. Pt dons pull over shirt in supported long sitting with A for sitting balance as pt slowly loses balance L  and A to pull shirt down trunk. Pt dons pull over shorts with A to bring BLE into figure 4 and Vc for orientation of clothing. In same positioning, pt dons socks with A to pull sock over heel. RN requesting no rolling at this time 2/2 needing to change wound dressing and pants left at knees as RN changing dressing after session. Pt brushes teeth and hair with HOB elevated. Exited session with pt seated in bed, call light in reach and wife present in room.   OT Evaluation Precautions/Restrictions  Precautions Precautions: Fall;Other (comment) Precaution Comments: paraplegia, Colostomy, PEG, Sacral wound vac,  Restrictions Weight Bearing Restrictions: No General Chart Reviewed: Yes Vital Signs Therapy Vitals Pulse Rate: 80 Resp: 18 Patient Position (if appropriate): Lying Oxygen Therapy SpO2: 93 % O2 Device: Not Delivered Pain Pain Assessment Pain Assessment: No/denies pain Pain Score: 9  Pain Type: Acute pain Pain Location: Head Pain Radiating Towards: back Pain Descriptors / Indicators: Aching Pain Frequency: Intermittent Pain Onset: Gradual Pain Intervention(s): Medication (See eMAR) Home Living/Prior Functioning Home Living Family/patient expects to be discharged to:: Private residence Living Arrangements: Spouse/significant other Available Help at Discharge: Family, Available 24 hours/day Type of Home: Apartment Home Access: Stairs to enter [employer] of Steps: 2 steps at side with plans to build a ramp. Home Layout: One level  Lives With: Spouse IADL History Homemaking Responsibilities: No Occupation: Full time employment Type of Occupation: HVAC Prior Function Level of Independence: Independent with basic ADLs, Independent with gait, Independent with transfers  Able to Take Stairs?: Reciprically Driving: Yes Vocation: Full time employment Leisure: Hobbies-yes (Comment) Comments: scuba diving, swimming,  ADL   Vision Baseline Vision/History: No  visual deficits Patient Visual Report: No change from baseline Vision Assessment?: No apparent visual deficits Perception  Perception: Within Functional Limits Praxis Praxis: Intact Cognition Overall Cognitive Status: Impaired/Different from baseline Arousal/Alertness: Awake/alert Orientation Level: Person;Place;Situation Person: Oriented Place: Oriented Situation: Oriented Year: 2018 Month: July Day of Week: Correct Memory: Impaired Immediate Memory Recall: Sock;Blue;Bed Memory Recall: Blue Memory Recall Blue: With Cue Awareness: Appears intact Problem Solving: Impaired Problem Solving Impairment: Functional basic Safety/Judgment: Appears intact Comments: Problem solving for new functional situations limited Sensation Sensation Light Touch: Impaired by gross assessment Light Touch Impaired Details: Impaired RLE;Impaired LLE Proprioception: Impaired Detail Proprioception Impaired Details: Impaired RLE;Impaired LLE Additional Comments: Pt 75% accurate with light touch on bil LEs, but 25% accurate proprioception at toes, ankles WNL Coordination Gross Motor Movements are Fluid and Coordinated: No Fine Motor Movements are Fluid and Coordinated: No Coordination and Movement Description: No motor coordination at bil LEs consistent with paraplegia Heel Shin Test: Unable Motor  Motor Motor: Paraplegia Motor - Skilled Clinical Observations: 0/5 strength throughout bil LEs and trunk to waistline Mobility  Bed Mobility Bed Mobility: Rolling Left;Rolling Right;Right Sidelying to Sit;Sitting - Scoot to [employer] of Bed;Sit to Supine;Scooting to [facility] Rolling Right: 2: Max assist Rolling Right Details (indicate cue type and reason): Overall, pt is able to assit with UEs during bed mobility for rolling and reaching,, but he needs step-by-step sequence and technique cues. Pt is unable ot assist iwth trunk or LEs for transitional movements.  Rolling Left: 2: Max assist Right Sidelying to Sit:  1: +2 Total assist Right Sidelying to Sit: Patient Percentage: 10% Sitting - Scoot to Edge of Bed: 1: +1 Total assist Sit to Supine: 1: +2 Total assist Sit to Supine: Patient Percentage: 10% Scooting to HOB: 1: +2 Total assist Scooting to [facility]: Patient Percentage: 0%  Trunk/Postural Assessment  Cervical Assessment Cervical Assessment: Exceptions to [facility] Cervical Strength Overall Cervical Strength Comments: Pt with forward head posture, unable to rest head in neutral position with flat HOB. ROM limited by capped trach as well.  Thoracic Assessment Thoracic Assessment: Exceptions to [facility] Thoracic Strength Overall Thoracic Strength Comments: Generalized weakness consistent with incomplete T5-T6 paraplegia Lumbar Assessment Lumbar Assessment: Exceptions to [facility] Lumbar Strength Overall Lumbar Strength Comments: Generalized weakness Postural Control Postural Control: Deficits on evaluation Head Control: Poor endurance contribrutes to downward head/neck positioning Trunk Control: poor trunk control, required Min A to maintain upright in supported long sitting Righting Reactions: Delayed and insufficient, reliance on UEs for control  Balance Balance Balance Assessed: Yes Static Sitting Balance Static Sitting - Balance Support: Right upper extremity supported;Left upper extremity supported;Feet supported Static Sitting - Level of Assistance: 2: Max assist Static Sitting - Comment/# of Minutes: 8 min with PT supporting trunk from behind for optimal extention Dynamic Sitting Balance Dynamic Sitting - Balance Support: Right upper extremity supported;Left upper extremity supported;Feet supported;During functional activity Dynamic Sitting - Level of Assistance: 2: Max assist Dynamic Sitting Balance - Compensations: UE reliance and head righting attempted Dynamic Sitting - Balance Activities: Lateral lean/weight shifting;Forward lean/weight shifting Sitting balance - Comments: 2 min with great  effort Extremity/Trunk Assessment RUE Assessment RUE Assessment: Exceptions to [facility] RUE Strength RUE Overall Strength Comments: Generalized weakness 3/5 throughout; TBA further LUE Assessment LUE Assessment: Exceptions to [facility] LUE Strength LUE Overall Strength Comments: Generalized weakness 3/5 throughout; TBA further   See Function Navigator for Current Functional Status.   Refer to Care Plan for Long Term Goals  Recommendations for other services: None    Discharge Criteria: Patient will be discharged from OT if patient refuses treatment 3 consecutive times without  medical reason, if treatment goals not met, if there is a change in medical status, if patient makes no progress towards goals or if patient is discharged from hospital.  The above assessment, treatment plan, treatment alternatives and goals were discussed and mutually agreed upon: by patient and by family  Tonny Branch 09/20/2016, 12:57 PM

## 2016-09-20 NOTE — Progress Notes (Signed)
Patient's wound vac dressing curled up during transfer, new dressing applied.   During turning to apply new dressing, patient de-sat to 50%.  Oxygen applied by [place] at 2 liters. Respiratory called. Patient deep suctioned and inner cannula changed. O2 sat returned to 96. Left patient to work with SLP. Will continue to monitor.

## 2016-09-21 ENCOUNTER — Inpatient Hospital Stay (HOSPITAL_COMMUNITY): Payer: Medicare Other | Admitting: Speech Pathology

## 2016-09-21 ENCOUNTER — Inpatient Hospital Stay (HOSPITAL_COMMUNITY): Payer: Medicare Other

## 2016-09-21 DIAGNOSIS — B379 Candidiasis, unspecified: Secondary | ICD-10-CM

## 2016-09-21 DIAGNOSIS — R0602 Shortness of breath: Secondary | ICD-10-CM

## 2016-09-21 DIAGNOSIS — F411 Generalized anxiety disorder: Secondary | ICD-10-CM

## 2016-09-21 LAB — GLUCOSE, CAPILLARY
GLUCOSE-CAPILLARY: 112 mg/dL — AB (ref 65–99)
GLUCOSE-CAPILLARY: 170 mg/dL — AB (ref 65–99)
GLUCOSE-CAPILLARY: 246 mg/dL — AB (ref 65–99)
Glucose-Capillary: 134 mg/dL — ABNORMAL HIGH (ref 65–99)

## 2016-09-21 LAB — URINE CULTURE: Culture: >=100000 — AB

## 2016-09-21 MED ORDER — PHENOL 1.4 % MT LIQD: 1.0000 | OROMUCOSAL | Status: DC | PRN

## 2016-09-21 NOTE — Progress Notes (Signed)
VASCULAR LAB PRELIMINARY  PRELIMINARY  PRELIMINARY  PRELIMINARY  Bilateral lower extremity venous duplex completed.    Preliminary report:  There is no DVT or SVT noted in the bilateral lower extremities.   Dvontae Ruan, RVT 09/21/2016, 2:08 PM

## 2016-09-21 NOTE — Progress Notes (Signed)
New order for chloraseptic spray prn due to c/o sore throat.

## 2016-09-21 NOTE — Progress Notes (Addendum)
PHYSICAL MEDICINE & REHABILITATION     PROGRESS NOTE  Subjective/Complaints:  Pt seen laying in bed this AM.  He slept well overnight.  He had a good first day of therapies yesterday. Pt reported to have anxiety yesterday.  ROS: Denies CP, SOB, N/V/D.  Objective: Vital Signs: Blood pressure 140/86, pulse 80, temperature 98 F (36.7 C), temperature source Oral, resp. rate 20, height 5' 10.5" (1.791 m), weight 71 kg (156 lb 7 oz), SpO2 97 %. Dg Chest 2 View  Result Date: 09/18/2016 CLINICAL DATA:  Patient with fall from ladder on 04/11/2016, paraplegia. Respiratory failure. Pneumonia. EXAM: CHEST  2 VIEW COMPARISON:  Chest radiograph 09/07/2016. FINDINGS: Limited rotated portable exam. Tracheostomy tube projects over the mid trachea. Stable enlarged cardiac and mediastinal contours status post median sternotomy. Unchanged superior fractured sternotomy wires. Heterogeneous opacities left lung base. Probable small left pleural effusion. Upper thoracic spine fusion hardware. Percutaneous gastrostomy tube. IMPRESSION: Limited rotated portable exam. Probable small left pleural effusion and underlying opacities which may represent atelectasis or infection. Electronically Signed   By: Lovey Newcomer M.D.   On: 08/26/2016 18:31    Recent Labs  09/20/16 0510  WBC 10.4  HGB 9.6*  HCT 31.3*  PLT 374    Recent Labs  09/20/16 0510  NA 139  K 3.3*  CL 102  GLUCOSE 116*  BUN 42*  CREATININE 1.05  CALCIUM 9.1   CBG (last 3)   Recent Labs  09/20/16 1629 09/20/16 2116 09/21/16 0549  GLUCAP 196* 218* 112*    Wt Readings from Last 3 Encounters:  09/21/16 71 kg (156 lb 7 oz)  09/17/16 66.7 kg (147 lb)    Physical Exam:  BP 140/86 (BP Location: Right Arm)   Pulse 80   Temp 98 F (36.7 C) (Oral)   Resp 20   Ht 5' 10.5" (1.791 m)   Wt 71 kg (156 lb 7 oz)   SpO2 97%   BMI 22.13 kg/m  Constitutional: He appears well-developedand well-nourished. NAD HENT: Normocephalicand  atraumatic.  Eyes: EOMI. No discharge. Neck: #5 Trach in place with cork.  Cardiovascular: RRR. No JVD. Respiratory: Effort normal. No stridor. +Hope GI: Soft. Bowel sounds are normal. +PEG. Colostomy patent. Genitourinary: Foley in place.  Musculoskeletal: He exhibits no edemaor tenderness.  Neurological: He is alert.  He is able to follow simple motor commands.  Motor: B/l UE 4/5 prox to distal.  B/l LE: 0/5 bilaterally throughout (unchanged).  Skin: Skin is warmand dry.  Sacral decub.  Psychiatric: His mood appears anxious. His speech is rapid and/or pressured. He is agitated. Cognition and memory are impaired.   Assessment/Plan: 1. 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 Physiatrist is providing close team supervision and 24 hour management of active medical problems listed below. Physiatrist and rehab team continue to assess barriers to discharge/monitor patient progress toward functional and medical goals.  Function:  Bathing Bathing position   Position: Bed  Bathing parts Body parts bathed by patient: Right arm, Left arm, Chest, Abdomen, Front perineal area, Right upper leg, Left upper leg Body parts bathed by helper: Right lower leg, Left lower leg  Bathing assist        Upper Body Dressing/Undressing Upper body dressing   What is the patient wearing?: Pull over shirt/dress     Pull over shirt/dress - Perfomed by patient: Thread/unthread right sleeve, Thread/unthread left sleeve, Put head through  opening Pull over shirt/dress - Perfomed by helper: Pull shirt over trunk        Upper body assist        Lower Body Dressing/Undressing Lower body dressing   What is the patient wearing?: Pants, Non-skid slipper socks     Pants- Performed by patient: Thread/unthread right pants leg, Thread/unthread left pants leg Pants- Performed by helper: Pull pants up/down  (slinical reasoning as RN requesting no rolling at this time)   Non-skid slipper socks- Performed by helper: Don/doff right sock, Don/doff left sock (OT holding LE in figure 4)                  Lower body assist Assist for lower body dressing:  (MAX A)      Toileting Toileting Toileting activity did not occur: Safety/medical concerns        Toileting assist     Transfers Chair/bed Clinical biochemist          Cognition Comprehension Comprehension assist level: Follows basic conversation/direction with no assist  Expression Expression assist level: Expresses basic 75 - 89% of the time/requires cueing 10 - 24% of the time. Needs helper to occlude trach/needs to repeat words.  Social Interaction Social Interaction assist level: Interacts appropriately with others with medication or extra time (anti-anxiety, antidepressant).  Problem Solving Problem solving assist level: Solves basic 75 - 89% of the time/requires cueing 10 - 24% of the time  Memory Memory assist level: Recognizes or recalls 75 - 89% of the time/requires cueing 10 - 24% of the time    Medical Problem List and Plan: 1. Paraplegia and functional deficits secondary to T5 SCI  Cont CIR 2. DVT Prophylaxis/Anticoagulation: Pharmaceutical: Lovenox 3. Pain Management: Oxycodone prn 4. Mood: team to provide ego support. LCSW to follow for evaluation and support  Xanax 0.25 BID PRN started on 7/28 5. Neuropsych: This patient is not fully capable of making decisions on hisown behalf. 6. Skin/Wound Care: Wound VAC to sacral area--has completed antibiotic regimen for osteomyelitis. Boost every 15 minutes when in chair.  7. Fluids/Electrolytes/Nutrition: strict I/O. Continue protein supplements.  8. T2DM: Monitor BS ac/hs. Continue Lantus insulin with SSI for elevated BS.   Labile at present, will adjust meds if persistently elevated 9. Chronic combined  systolic/diastolic CHF: Monitor for signs of overload. Check weight daily. Heart healthy diet. On hydralazine, losartan and imdur. Intolerant of statins.  Filed Weights   09/14/2016 1526 09/20/16 0437 09/21/16 0500  Weight: 70.8 kg (156 lb) 70.8 kg (156 lb 1.4 oz) 71 kg (156 lb 7 oz)  10. Anxiety disorder: Continue buspar bid with Seroquel at nights. 11. Hypothyroid: On synthroid for supplement.   TSH elevated on 7/28, will consider increase in meds 12. TBI: On modafinil for attention/activation?  13. ABLA: improving. Continue iron supplement.   Hb 9.6 on 7/28  Cont to monitor 14. Acute on chronic renal failure?: Added water flushes via PEG.   Cr. 1.05 on 7/28  Cont to monitor 15. Intermittent leucocytosis: Monitor for signs of infection. Most recently treated for Kleb UTI.   Recheck with >100K yeast, cont to monitor for symptoms 16. Stage IV sacral decub: Air mattress overlay. WOC for input. Continue wound VAC. Added vitamin to promote healing. Calorie count--may need tube feeds at nights as intake variable and has high nutritional needs.  17. Intermittent abdominal  pain: CT scan 7/23 negative for obst or inflammation.  45. Trach dependent: Encourage IS due to atelectasis. SOB likely psychogenic.   CXR reviewed, with likely atelectasis, cont to monitor 19. Urinary retention  Cont Foley for wound 20. Hypokalemia  K+ 3.3 on 7/28  Supplemented x1 day  Cont to monitor 21. Hypoalbuminemia  Supplement initiated 7/28   LOS (Days) 2 A FACE TO FACE EVALUATION WAS PERFORMED  Shaniya Tashiro Lorie Phenix 09/21/2016 8:23 AM

## 2016-09-22 ENCOUNTER — Inpatient Hospital Stay (HOSPITAL_COMMUNITY): Payer: Medicare Other | Admitting: Speech Pathology

## 2016-09-22 ENCOUNTER — Inpatient Hospital Stay (HOSPITAL_COMMUNITY): Payer: Medicare Other

## 2016-09-22 ENCOUNTER — Inpatient Hospital Stay (HOSPITAL_COMMUNITY): Payer: Medicare Other | Admitting: *Deleted

## 2016-09-22 ENCOUNTER — Inpatient Hospital Stay (HOSPITAL_COMMUNITY): Payer: Medicare Other | Admitting: Physical Therapy

## 2016-09-22 DIAGNOSIS — L89154 Pressure ulcer of sacral region, stage 4: Secondary | ICD-10-CM

## 2016-09-22 DIAGNOSIS — N179 Acute kidney failure, unspecified: Secondary | ICD-10-CM

## 2016-09-22 LAB — GLUCOSE, CAPILLARY
GLUCOSE-CAPILLARY: 102 mg/dL — AB (ref 65–99)
GLUCOSE-CAPILLARY: 91 mg/dL (ref 65–99)
GLUCOSE-CAPILLARY: 197 mg/dL — AB (ref 65–99)
Glucose-Capillary: 202 mg/dL — ABNORMAL HIGH (ref 65–99)

## 2016-09-22 MED ORDER — VITAMIN C 500 MG PO TABS
500.0000 mg | ORAL_TABLET | Freq: Two times a day (BID) | ORAL | Status: DC
  Administered 2016-09-22 – 2016-09-24 (×6): 500 mg
  Filled 2016-09-22 (×6): qty 1

## 2016-09-22 MED ORDER — PRO-STAT SUGAR FREE PO LIQD
30.0000 mL | Freq: Three times a day (TID) | ORAL | Status: DC
  Administered 2016-09-22 – 2016-09-24 (×7): 30 mL via ORAL
  Filled 2016-09-22 (×6): qty 30

## 2016-09-22 MED ORDER — PROCHLORPERAZINE EDISYLATE 5 MG/ML IJ SOLN: 5.0000 mg | Freq: Four times a day (QID) | INTRAMUSCULAR | Status: DC | PRN

## 2016-09-22 MED ORDER — ALPRAZOLAM 0.5 MG PO TABS
0.5000 mg | ORAL_TABLET | Freq: Two times a day (BID) | ORAL | Status: DC
  Administered 2016-09-23 – 2016-09-24 (×3): 0.5 mg via ORAL
  Filled 2016-09-22 (×3): qty 1

## 2016-09-22 MED ORDER — POTASSIUM CHLORIDE CRYS ER 20 MEQ PO TBCR
20.0000 meq | EXTENDED_RELEASE_TABLET | Freq: Two times a day (BID) | ORAL | Status: DC
  Administered 2016-09-22 – 2016-09-24 (×5): 20 meq via ORAL
  Filled 2016-09-22 (×5): qty 1

## 2016-09-22 MED ORDER — PROCHLORPERAZINE 25 MG RE SUPP: 12.5000 mg | Freq: Four times a day (QID) | RECTAL | Status: DC | PRN

## 2016-09-22 MED ORDER — ALPRAZOLAM 0.25 MG PO TABS
0.2500 mg | ORAL_TABLET | Freq: Once | ORAL | Status: AC
Start: 2016-09-22 — End: 2016-09-22
  Administered 2016-09-22: 0.25 mg via ORAL
  Filled 2016-09-22: qty 1

## 2016-09-22 MED ORDER — ACETAMINOPHEN 325 MG PO TABS
650.0000 mg | ORAL_TABLET | ORAL | Status: DC | PRN
  Administered 2016-09-22 – 2016-09-25 (×9): 650 mg via ORAL
  Filled 2016-09-22 (×9): qty 2

## 2016-09-22 MED ORDER — GUAIFENESIN-DM 100-10 MG/5ML PO SYRP
10.0000 mL | ORAL_SOLUTION | Freq: Four times a day (QID) | ORAL | Status: DC | PRN
  Administered 2016-09-22 – 2016-09-25 (×2): 10 mL via ORAL
  Filled 2016-09-22 (×2): qty 10

## 2016-09-22 MED ORDER — ALPRAZOLAM 0.25 MG PO TABS
0.2500 mg | ORAL_TABLET | Freq: Two times a day (BID) | ORAL | Status: DC
  Administered 2016-09-22: 0.25 mg via ORAL
  Filled 2016-09-22: qty 1

## 2016-09-22 MED ORDER — PROCHLORPERAZINE MALEATE 5 MG PO TABS: 5.0000 mg | ORAL_TABLET | Freq: Four times a day (QID) | ORAL | Status: DC | PRN

## 2016-09-22 MED ORDER — FLUCONAZOLE 100 MG PO TABS
100.0000 mg | ORAL_TABLET | Freq: Every day | ORAL | Status: DC
  Administered 2016-09-22 – 2016-09-24 (×3): 100 mg via ORAL
  Filled 2016-09-22 (×3): qty 1

## 2016-09-22 MED ORDER — ZINC SULFATE 220 (50 ZN) MG PO CAPS
220.0000 mg | ORAL_CAPSULE | Freq: Two times a day (BID) | ORAL | Status: DC
  Administered 2016-09-22 – 2016-09-24 (×6): 220 mg via ORAL
  Filled 2016-09-22 (×7): qty 1

## 2016-09-22 MED ORDER — DIPHENHYDRAMINE HCL 12.5 MG/5ML PO ELIX: 25.0000 mg | ORAL_SOLUTION | Freq: Four times a day (QID) | ORAL | Status: DC | PRN

## 2016-09-22 MED ORDER — INSULIN GLARGINE 100 UNIT/ML ~~LOC~~ SOLN
5.0000 [IU] | Freq: Every day | SUBCUTANEOUS | Status: DC
  Administered 2016-09-22: 5 [IU] via SUBCUTANEOUS
  Filled 2016-09-22 (×3): qty 0.05

## 2016-09-22 MED ORDER — VITAMIN C 500 MG/5ML PO SYRP: 500.0000 mg | ORAL_SOLUTION | Freq: Two times a day (BID) | ORAL | Status: DC

## 2016-09-22 NOTE — Progress Notes (Signed)
[facility]ILIT[facility]     PROGRESS NOTE  Subjective/Complaints:  In bed. A little chilly. Had a good weekend. Appetite better. Having issues with seal on vac for sacral wound.  ROS: pt denies nausea, vomiting, diarrhea, cough, shortness of breath or chest pain   Objective: Vital Signs: Blood pressure (!) 152/98, pulse 96, temperature 98 F (36.7 C), temperature source Oral, resp. rate 18, height 5' 10.5" (1.791 m), weight 71.2 kg (157 lb), SpO2 92 %. No results found.  Recent Labs  09/20/16 0510  WBC 10.4  HGB 9.6*  HCT 31.3*  PLT 374    Recent Labs  09/20/16 0510  NA 139  K 3.3*  CL 102  GLUCOSE 116*  BUN 42*  CREATININE 1.05  CALCIUM 9.1   CBG (last 3)   Recent Labs  09/21/16 1657 09/21/16 2135 09/22/16 0619  GLUCAP 134* 246* 102*    Wt Readings from Last 3 Encounters:  09/22/16 71.2 kg (157 lb)  09/17/16 66.7 kg (147 lb)    Physical Exam:  BP (!) 152/98 (BP Location: Right Arm)   Pulse 96   Temp 98 F (36.7 C) (Oral)   Resp 18   Ht 5' 10.5" (1.791 m)   Wt 71.2 kg (157 lb)   SpO2 92%   BMI 22.21 kg/m  Constitutional: He appears well-developedand well-nourished. NAD HENT: Normocephalicand atraumatic.  Eyes: EOMI. No discharge. Neck: #5 Trach in place with cork. Voice hoarse  Cardiovascular: RRR without murmur. No JVD . Respiratory:  Decreased sounds at bases. Normal effort GI: Soft. Bowel sounds are normal. +PEG. Colostomy patent. Genitourinary: Foley in place.  Musculoskeletal: He exhibits no edemaor tenderness.  Neurological: He is alert.  He is able to follow simple motor commands.  Motor: B/l UE 4/5 prox to distal.  B/l LE: 0/5 bilaterally throughout (unchanged).  Skin: Skin is warmand dry.  Sacral decub/vac/leak.  Psychiatric: impaired short term memory, anxious. .   Assessment/Plan: 1. 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 Physiatrist is providing close team supervision and 24 hour management of active medical problems listed below. Physiatrist and rehab team continue to assess barriers to discharge/monitor patient progress toward functional and medical goals.  Function:  Bathing Bathing position   Position: Bed  Bathing parts Body parts bathed by patient: Right arm, Left arm, Chest, Abdomen, Front perineal area, Right upper leg, Left upper leg Body parts bathed by helper: Right lower leg, Left lower leg  Bathing assist        Upper Body Dressing/Undressing Upper body dressing   What is the patient wearing?: Pull over shirt/dress     Pull over shirt/dress - Perfomed by patient: Thread/unthread right sleeve, Thread/unthread left sleeve, Put head through opening Pull over shirt/dress - Perfomed by helper: Pull shirt over trunk        Upper body assist        Lower Body Dressing/Undressing Lower body dressing   What is the patient wearing?: Pants, Non-skid slipper socks     Pants- Performed by patient: Thread/unthread right pants leg, Thread/unthread left pants leg Pants- Performed by helper: Pull pants up/down (slinical reasoning as RN requesting no rolling at this time)   Non-skid slipper socks- Performed by helper: Don/doff right sock, Don/doff left sock (OT holding LE in figure 4)                  Lower  body assist Assist for lower body dressing:  (MAX A)      Toileting Toileting Toileting activity did not occur: Safety/medical concerns        Toileting assist     Transfers Chair/bed Clinical biochemist          Cognition Comprehension Comprehension assist level: Follows basic conversation/direction with no assist  Expression Expression assist level: Expresses basic 75 - 89% of the time/requires cueing 10 - 24% of the time. Needs helper to occlude trach/needs to repeat words.  Social  Interaction Social Interaction assist level: Interacts appropriately with others with medication or extra time (anti-anxiety, antidepressant).  Problem Solving Problem solving assist level: Solves basic 75 - 89% of the time/requires cueing 10 - 24% of the time  Memory Memory assist level: Recognizes or recalls 75 - 89% of the time/requires cueing 10 - 24% of the time    Medical Problem List and Plan: 1. 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 DVT Prophylaxis/Anticoagulation: Pharmaceutical: Lovenox 3. Pain Management: Oxycodone prn 4. Mood: team to provide ego support. LCSW to follow for evaluation and support  Xanax 0.25 BID PRN started on 7/28 5. Neuropsych: This patient is not fully capable of making decisions on hisown behalf. 6. Skin/Wound Care: Wound VAC to sacral area--has completed antibiotic regimen for osteomyelitis. Boost every 15 minutes when in chair.  7. Fluids/Electrolytes/Nutrition: strict I/O. Continue protein supplements.   -intake improving 8. T2DM: Monitor BS ac/hs. Continue Lantus insulin with SSI for elevated BS.   -elevated in PM  -continue 10u lantus qhs, add 5u qam 9. Chronic combined systolic/diastolic CHF: Monitor for signs of overload. Check weight daily. Heart healthy diet. On hydralazine, losartan and imdur. Intolerant of statins.  Filed Weights   09/20/16 0437 09/21/16 0500 09/22/16 0500  Weight: 70.8 kg (156 lb 1.4 oz) 71 kg (156 lb 7 oz) 71.2 kg (157 lb)  10. Anxiety disorder: Continue buspar bid with Seroquel at nights. 11. Hypothyroid: On synthroid for supplement.   TSH elevated on 7/28, will consider increase in meds if persistently elevated   -recheck T4 levels along with TSH in a week or two 12. TBI: On modafinil for attention/activation?  13. ABLA: improving. Continue iron supplement.   Hb 9.6 on 7/28  Cont to monitor 14. Acute on chronic renal failure?: Added water flushes via PEG.   Cr. 1.05 on  7/28  Cont to monitor 15. Intermittent leucocytosis: Monitor for signs of infection. Most recently treated for Kleb UTI.   Recheck with >100K yeast, cont to monitor for symptoms   -diflucan for 5 days 16. Stage IV sacral decub: Air mattress overlay. WOC for input. Continue wound VAC.  -having issues with seal---?plastics follow up  - Added vitamin to promote healing.   -eating better 17. Intermittent abdominal pain: CT scan 7/23 negative for obst or inflammation.  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 SOB likely psychogenic.   CXR reviewed, with likely atelectasis, cont to monitor  -no reason to continue trach---decannulate 19. Urinary retention  Cont Foley for wound 20. Hypokalemia  K+ 3.3 on 7/28  Supplemented x1 day  Follow up later this wek 21. Hypoalbuminemia  Supplement initiated 7/28   LOS (Days) 3 A FACE TO FACE EVALUATION WAS PERFORMED  Aerilynn Goin T 09/22/2016 8:33 AM

## 2016-09-22 NOTE — Consult Note (Addendum)
0830:WOC follow-up: Bedside nurse states Vac dressing leaked on Fri evening and also had to be changed on Sat when it could not maintain a seal.  Difficult to keep dressing in place when pt moving frequently in therapy sessions.  Attempted to reapply dressing but skin surrounding sacrum and buttocks was wet and macerated and unable to maintain a seal for the drape.  Pt is very anxious and upset and stated he is having a panic attack when dressing change was attempted.  Discussed difficulty of being unable to maintain a seal on the Vac dressing with PA, [name] Love.  Moist gauze dressing was applied and will re-attempt to apply Vac dressing on WED at 0800 as requested. Sacrum wound beefy red with exposed bone and mod amt yellow drainage; unchanged in appearance since previous consult.  Pt is on a low air-loss befd to decrease pressure and staff nurse changed ostomy pouch this am.  Reviewed plan of care with patient who verbalized understanding.  Julien Girt MSN, RN, [place], [place], [place]

## 2016-09-22 NOTE — Progress Notes (Signed)
Speech Language Pathology Daily Session Note  Patient Details  Name: Gregory Hester MRN: [id number] Date of Birth: [birth year]  Today's Date: 09/22/2016 SLP Individual Time: 1100-1200 SLP Individual Time Calculation (min): 60 min  Short Term Goals: Week 1: SLP Short Term Goal 1 (Week 1): Pt will consume therapeutic trials of ice chips with minimal overt s/s of aspiration and mod I use of swallowing precautions over 3 consecutive sessions prior to repeat objective assessment.   SLP Short Term Goal 2 (Week 1): Pt will consume dys 3 textures and nectar thick liquids with mod I use of swallowing precautions and minimal overt s/s of aspiration.   SLP Short Term Goal 3 (Week 1): Pt will utilize external aids to recall daily information with min assist verbal cues.  SLP Short Term Goal 4 (Week 1): Pt will complete basic, familiar tasks with min assist for functional problem solving.   SLP Short Term Goal 5 (Week 1): Pt will increase vocal intensity to achieve intelligibility during conversations in noisy environments with supervision verbal cues.    Skilled Therapeutic Interventions: Skilled treatment session focused on dysphagia and speech communication goals. SLP facilitated session by reviewing MBS on 08/28/16 with pt and wife, need for repeat instrumental study prior to upgrade given significant respiratory deficits and extensive history of aspiration pneumonia. SLP further facilitated session by providing skilled observation of dysphagia 3 lunch tray with nectar thick liquids via cup. Pt free of overt s/s of aspiration but required Max A for anxiety and to increase consumption. Pt currently has capped trach but is scheduled to be decannulated this day. Pt required Total A cues to increase vocal intensity at the word level to achieve ~ 75% intelligibility. Although pt maintained his stats on room air, he required Total A cues to slow breathing and to perform pursed lipped breathing d/t anxiety. All  questions answered to pt and wife satisfaction. Pt returned to room, left upright in wheelchair with wife present and all needs within reach. Continue per current plan of care.      Function:  Eating Eating   Modified Consistency Diet: Yes Eating Assist Level: Supervision or verbal cues           Cognition Comprehension Comprehension assist level: Understands basic 90% of the time/cues < 10% of the time  Expression   Expression assist level: Expresses basic 50 - 74% of the time/requires cueing 25 - 49% of the time. Needs to repeat parts of sentences.;Expresses basic 25 - 49% of the time/requires cueing 50 - 75% of the time. Uses single words/gestures.  Social Interaction Social Interaction assist level: Interacts appropriately 50 - 74% of the time - May be physically or verbally inappropriate.  Problem Solving Problem solving assist level: Solves basic 50 - 74% of the time/requires cueing 25 - 49% of the time  Memory Memory assist level: Recognizes or recalls 50 - 74% of the time/requires cueing 25 - 49% of the time    Pain Pain Assessment Pain Assessment: 0-10 Pain Score: 3  Pain Type: Acute pain Pain Location: Buttocks Pain Orientation: Right;Left Pain Descriptors / Indicators: Aching Pain Frequency: Constant Pain Onset: On-going Pain Intervention(s): Medication (See eMAR)  Therapy/Group: Individual Therapy  Shamarie Call 09/22/2016, 3:04 PM

## 2016-09-22 NOTE — Consult Note (Signed)
Reason for Consult: sacral ulcer Referring Physician:   LUCERO IDE is an 71 y.o. male.  HPI: The patient is a 71 yrs old wm here for  He sustained a T5-T6 incomplete spinal cord injury from a ladder fall February 2018.  He had multiple skull fractures and right radius fracture. He had T2-6 posterior decompression at [facility] with repair of the radius fracture and a trach. He was discharge to [facility] a month later.  He was admitted to [facility] for aspiration pneumonitis and respiratory failure.  During this entire period he developed a stage IV sacral ulcer with osteo and VRE/pseudomonas.  He was transferred to [facility] June 2018 with the need for a Vent at night. He has been treated with antibiotics for the infection. He has chronic systolic heart failure with elevated blood pressures and rising blood sugars.  The wound is at the midline of the lower sacral area ~ 8 x 10 cm.  He was being treated with the [facility] but now that he is in rehab the [facility] won't keep a seal with the PT.    Past Medical History:  Diagnosis Date  . Bursitis of hip, right   . Chronic combined systolic (congestive) and diastolic (congestive) heart failure ([place])   . Diabetes mellitus ([place])   . HTN (hypertension)   . Hypospadias   . Hypothyroid   . Osteomyelitis of sacrum ([place])   . Sacral decubitus ulcer, stage IV ([place])   . Thrombocytosis ([place])     Past Surgical History:  Procedure Laterality Date  . BACK SURGERY  2015  . CERVICAL SPINE SURGERY    . CORONARY ARTERY BYPASS GRAFT  2014  . HERNIA REPAIR    . TOTAL HIP ARTHROPLASTY Right   . TOTAL HIP ARTHROPLASTY  left    Family History  Problem Relation Age of Onset  . Cancer Mother     Social History:  reports that he has never smoked. He has never used smokeless tobacco. He reports that he does not drink alcohol or use drugs.  Allergies:  Allergies  Allergen Reactions  . Statins Other (See Comments)    myalgias  . Zocor [Simvastatin] Other  (See Comments)    myalgias    Medications: I have reviewed the patient's current medications.  Results for orders placed or performed during the hospital encounter of 09/05/2016 (from the past 48 hour(s))  Glucose, capillary     Status: Abnormal   Collection Time: 09/20/16  9:16 PM  Result Value Ref Range   Glucose-Capillary 218 (H) 65 - 99 mg/dL  Glucose, capillary     Status: Abnormal   Collection Time: 09/21/16  5:49 AM  Result Value Ref Range   Glucose-Capillary 112 (H) 65 - 99 mg/dL  Glucose, capillary     Status: Abnormal   Collection Time: 09/21/16 11:55 AM  Result Value Ref Range   Glucose-Capillary 170 (H) 65 - 99 mg/dL  Glucose, capillary     Status: Abnormal   Collection Time: 09/21/16  4:57 PM  Result Value Ref Range   Glucose-Capillary 134 (H) 65 - 99 mg/dL  Glucose, capillary     Status: Abnormal   Collection Time: 09/21/16  9:35 PM  Result Value Ref Range   Glucose-Capillary 246 (H) 65 - 99 mg/dL  Glucose, capillary     Status: Abnormal   Collection Time: 09/22/16  6:19 AM  Result Value Ref Range   Glucose-Capillary 102 (H) 65 - 99 mg/dL  Glucose, capillary  Status: Abnormal   Collection Time: 09/22/16 12:01 PM  Result Value Ref Range   Glucose-Capillary 202 (H) 65 - 99 mg/dL  Glucose, capillary     Status: None   Collection Time: 09/22/16  5:07 PM  Result Value Ref Range   Glucose-Capillary 91 65 - 99 mg/dL    No results found.  Review of Systems  Constitutional: Positive for weight loss.  HENT: Negative.   Eyes: Negative.   Respiratory: Positive for sputum production, shortness of breath and wheezing.   Cardiovascular: Negative.   Gastrointestinal: Negative.   Genitourinary: Negative.   Musculoskeletal: Negative.   Neurological: Positive for weakness.   Blood pressure (!) 147/77, pulse 78, temperature 97.8 F (36.6 C), temperature source Oral, resp. rate 18, height 5' 10.5" (1.791 m), weight 71.2 kg (157 lb), SpO2 92 %. Physical Exam    Constitutional: He is oriented to person, place, and time.  HENT:  Head: Normocephalic.  Eyes: Pupils are equal, round, and reactive to light. EOM are normal.  Cardiovascular: Normal rate.   Respiratory: Effort normal. He has wheezes.  Musculoskeletal:       Back:  Neurological: He is alert and oriented to person, place, and time.  Skin: Skin is warm.  Psychiatric: He has a normal mood and affect.    Assessment/Plan: With the patients multiple medical comorbidities, he is not a good candidate for any surgery.  I recommend maximizing nutritional protein supplementation, Vit C 500 mg daily, Zinc 220 mg Daily, Multivitamin daily.  Monitor prealbumin.  Air mattress bed.  Off load and turn regularly with a minimum of every 2 hours.  Hydrotherapy might be helpful but I don't think the patient could tolerate the treatment.  Duoderm could be tried around the wound to help with a seal for a VAC but likely will leak due to PT.  Collagen once discharge to home or rehab can be used or even a silver alginate for any drainage.  Recommend continue with the wet to moist saline dressing for now.  I can't think of another dressing that won't be equally disrupted with the movement.  Off loading is key.  Thank you for the consult. Wallace Going 09/22/2016, 5:55 PM

## 2016-09-22 NOTE — Progress Notes (Signed)
Physical Therapy Session Note  Patient Details  Name: Gregory Hester MRN: [id number] Date of Birth: [birth year]  Today's Date: 09/22/2016 PT Individual Time: 0900-0930 PT Individual Time Calculation (min): 30 min   Short Term Goals: Week 1:  PT Short Term Goal 1 (Week 1): Pt will sit EOB x10 min with Mod A  PT Short Term Goal 2 (Week 1): Pt will remain OOB in TIS WC x2hr/day PT Short Term Goal 3 (Week 1): Pt will initiate WC propulsion in standard WC PT Short Term Goal 4 (Week 1): Pt will initiate leg loop training for bed mobility PT Short Term Goal 5 (Week 1): Pt will roll R/L with Mod A for LE management  Skilled Therapeutic Interventions/Progress Updates:    Tx focused on bed mobility, pain relief, and education on mobility. Pt up eating in bed with many various complaints of pains, wound vac, busy morning, etc. Resting HR 110bpm on RA with 02 90%. Applied 2L [place] and pt more comfotable.   Rolling R/L x3 each for dressing and cleaning with Mod A and multi-modal cues for technique. Pt only needs A with LE management, able to pull with bedrails. Pt able to assist pull to [facility] with Mod A.   Educated pt on importance of mobility despite discomfort as well as anxiety reduction strategies.  Reviewed incentive spirometer use and instructed pt to perform hourly for increased cardiorespiratory endurance. Performed PROM to bil LE in functional planes x5 bil. WIfe present and supportive. Hand off to next PT.   Therapy Documentation Precautions:  Precautions Precautions: Fall, Other (comment) Precaution Comments: paraplegia, Colostomy, PEG, Sacral wound vac,  Restrictions Weight Bearing Restrictions: No    Vital Signs: Therapy Vitals Temp: 98 F (36.7 C) Temp Source: Oral Pulse Rate: 96 Resp: 18 BP: (!) 152/98 Patient Position (if appropriate): Lying Oxygen Therapy SpO2: 92 % O2 Device: Not Delivered Pain: Pain Assessment Pain Assessment: No/denies pain Pain Score: 0-No pain      See Function Navigator for Current Functional Status.   Therapy/Group: Individual Therapy  Kennieth Rad, PT, DPT  Dwaine Deter, Gregory Hester 09/22/2016, 9:20 AM

## 2016-09-22 NOTE — Progress Notes (Signed)
Physical Therapy Note  Patient Details  Name: Gregory Hester MRN: [id number] Date of Birth: 06/17/1945 Today's Date: 09/22/2016    Time: [phone] 60 minutes  1:1 No c/o pain. Session focused on sitting tolerance in TIS w/c. Rolling to place maxi move pad with +2 assist, maxi move transfer to w/c. Pt unable to tolerate sitting position in maxi move due to "pressure on lungs", pressure eases with reclined position.  Pt able to tolerate up in w/c x 45 minutes during session with decrease angle of recline x 3 throughout session.  Adjusted head rest and put padding to prevent breakdown on lateral knees.  Pt participated in UE activities with wii golf and ball toss. Pt with low frustration tolerance for wii, states he enjoys more "physical" activities.  Pt BP WNL during seated in w/c.  Pt left in w/c with wife present and needs at hand at end of session.   [name] 09/22/2016, 10:29 AM

## 2016-09-22 NOTE — IPOC Note (Signed)
Overall Plan of Care (IPOC) Patient Details Name: Gregory Hester MRN: [id number] DOB: 11/19/1945  Admitting Diagnosis: SCI  Hospital Problems: Principal Problem:   Spinal cord injury, T1-T6 ([place]) Active Problems:   Neurogenic bowel   Neurogenic bladder   Tracheostomy status ([place])   Colostomy status ([place])   Hypoalbuminemia due to protein-calorie malnutrition ([place])   Hypokalemia   Urinary retention   Decubitus ulcer of sacral region, stage 4 (HCC)   AKI (acute kidney injury) ([place])   Hypothyroidism   Diabetes mellitus type 2 in nonobese ([place])   Yeast infection   Anxiety state     Functional Problem List: Nursing Bladder, Bowel, Edema, Endurance, Medication Management, Motor, Skin Integrity, Nutrition  PT Balance, Endurance, Motor, Pain, Nutrition, Safety, Sensory, Skin Integrity  OT Balance, Cognition, Endurance, Motor, Pain, Safety, Sensory, Skin Integrity  SLP Cognition, Nutrition  TR         Basic ADL's: OT Grooming, Bathing, Dressing, Toileting     Advanced  ADL's: OT       Transfers: PT Bed Mobility, Bed to Chair, Car  OT Toilet     Locomotion: PT Wheelchair Mobility     Additional Impairments: OT None  SLP Swallowing, Communication, Social Cognition expression Memory, Awareness, Problem Solving  TR      Anticipated Outcomes Item Anticipated Outcome  Self Feeding S  Swallowing  Mod I with least restrictive diet    Basic self-care  MOD A  Toileting  MAX A toileting; MOD A bathing   Bathroom Transfers MOD A toilet  Bowel/Bladder  Max assist  Transfers  Mod A Sliding Board  Locomotion  WC x150' with S   Communication  Mod I for intelligibility   Cognition  Supervision   Pain  < 3  Safety/Judgment  Mod I   Therapy Plan: PT Intensity: Minimum of 1-2 x/day ,45 to 90 minutes PT Frequency: 5 out of 7 days PT Duration Estimated Length of Stay: 3 weeks OT Intensity: Minimum of 1-2 x/day, 45 to 90 minutes OT Frequency: 5 out of 7 days OT  Duration/Estimated Length of Stay: 21-25 days SLP Intensity: Minumum of 1-2 x/day, 30 to 90 minutes SLP Frequency: 3 to 5 out of 7 days SLP Duration/Estimated Length of Stay: 21 days     Team Interventions: Nursing Interventions Patient/Family Education, Bladder Management, Bowel Management, Medication Management, Disease Management/Prevention, Skin Care/Wound Management, Discharge Planning, Psychosocial Support, Dysphagia/Aspiration Precaution Training  PT interventions Balance/vestibular training, Cognitive remediation/compensation, Community reintegration, Discharge planning, Disease management/prevention, DME/adaptive equipment instruction, Functional electrical stimulation, Functional mobility training, Neuromuscular re-education, Pain management, Patient/family education, Psychosocial support, Skin care/wound management, Splinting/orthotics, Therapeutic Activities, Therapeutic Exercise, UE/LE Strength taining/ROM, UE/LE Coordination activities, Wheelchair propulsion/positioning  OT Interventions Training and development officer, Cognitive remediation/compensation, Discharge planning, DME/adaptive equipment instruction, Functional mobility training, Pain management, Psychosocial support, Patient/family education, Self Care/advanced ADL retraining, Skin care/wound managment, Therapeutic Activities, Therapeutic Exercise, UE/LE Strength taining/ROM, UE/LE Coordination activities, Wheelchair propulsion/positioning  SLP Interventions Cognitive remediation/compensation, English as a second language teacher, Dysphagia/aspiration precaution training, Environmental controls, Functional tasks, Internal/external aids, Speech/Language facilitation, Patient/family education  TR Interventions    SW/CM Interventions Discharge Planning, Psychosocial Support, Patient/Family Education   Barriers to Discharge MD  Medical stability and [place], Trach, Other (comments) (ostomy-new)    PT Inaccessible home environment,  Medical stability, Home environment access/layout, Trach, Neurogenic Bowel & Bladder, Wound Care Home modification and significant caregiver burden  OT Inaccessible home environment, Medical stability, Trach, Wound Care    SLP  SW       Team Discharge Planning: Destination: PT-Home ,OT- Home , SLP-Home Projected Follow-up: PT-Home health PT, 24 hour supervision/assistance, OT-  Home health OT, SLP-24 hour supervision/assistance, Home Health SLP Projected Equipment Needs: PT-Sliding board, Wheelchair (measurements), Wheelchair cushion (measurements), OT- To be determined (DAC;TTB), SLP-None recommended by SLP Equipment Details: PT-18x18 with pressure relief cushipn, OT-  Patient/family involved in discharge planning: PT- Family member/caregiver, Patient,  OT-Patient, Family member/caregiver, SLP-Patient, Family member/caregiver  MD ELOS: 21-24 days Medical Rehab Prognosis:  Good Assessment: The patient has been admitted for CIR therapies with the diagnosis of thoracic myelopathy/debility. The team will be addressing functional mobility, strength, stamina, balance, safety, adaptive techniques and equipment, self-care, bowel and bladder mgt, patient and caregiver education, trach mgt, anxiety control, cognition, swallowing, ego support. Goals have been set at mod to max assist with self-care, mod assist with transfers, mod I with w/c locomotion, mod I to supervision with cognition and communication.    Meredith Staggers, MD, FAAPMR      See Team Conference Notes for weekly updates to the plan of care

## 2016-09-22 NOTE — Progress Notes (Signed)
Occupational Therapy Session Note  Patient Details  Name: Gregory Hester MRN: [id number] Date of Birth: 1945/03/16  Today's Date: 09/22/2016 OT Individual Time: 1300-1355 OT Individual Time Calculation (min): 55 min    Short Term Goals: Week 1:  OT Short Term Goal 1 (Week 1): Pt will sit EOM/EOB 10 min with MOD A for sitting balance OT Short Term Goal 2 (Week 1): Pt will don pull over shirt with touching A for balance OT Short Term Goal 3 (Week 1): Pt will participate in 5 min of functional activity without a rest break OT Short Term Goal 4 (Week 1): Pt will transfer to chair with MAX A of 2  Skilled Therapeutic Interventions/Progress Updates:    Pt resting in bed upon arrival with wife present.  Educated pt on role of OT, LTGs, purpose of Rehab.  Pt expressed increased anxiety with all transitional movements secondary to limited balance and inability to move BLE.  Pt also concerned that colostomy bag will "burst" again.  Raised HOB to 35 degrees with increased anxiety noted and report of increased pain in abdominal area.  Pt required max verbal cues for pursed lip breathing.  Pt also educated on importance of coughing while occluding trach site (pt decannulated prior to therapy). Pt's wife given Home measurement sheet.  Wife state that a ramp is being built and doors widened in addition to remodeling of extra bedroom and bathroom.  Per report shower will be roll in. Pt remained in bed with all needs within reach and wife present.   Therapy Documentation Precautions:  Precautions Precautions: Fall, Other (comment) Precaution Comments: paraplegia, Colostomy, PEG, Sacral wound vac,  Restrictions Weight Bearing Restrictions: No  Pain: Pain Assessment Pain Assessment: 0-10 Pain Score: 2 Pain Type: Acute pain Pain Location: Buttocks Pain Orientation: Right;Left Pain Descriptors / Indicators: Aching Pain Frequency: Constant Pain Onset: On-going Pain Intervention(s): RN aware and  repositioned  See Function Navigator for Current Functional Status.   Therapy/Group: Individual Therapy  Leroy Libman 09/22/2016, 2:48 PM

## 2016-09-22 NOTE — Progress Notes (Signed)
PT decannulated.  Currently on room air and showing no signs of distress.

## 2016-09-22 NOTE — Progress Notes (Signed)
Calorie Count Note  48 hour calorie count ordered. Day 1 and 2 results below.  Diet: Dysphagia 3 (CHO mod/HH diet), Nectar thick Supplements:  -Boost Plus chocolate BID- Each supplement provides 360kcal and 14g protein.   -Prostat liquid protein PO 30 ml BID with meals, each supplement provides 100 kcal, 15 grams protein.  7/28: Breakfast: 562 kcal, 15g protein Lunch: 444 kcal, 18g protein Dinner: 895 kcal, 31g protein Supplements: 200 kcal, 30g protein  Total intake: 2101 kcal (98% of minimum estimated needs)  94g protein (88% of minimum estimated needs)  7/29: Breakfast: 421 kcal, 11g protein Lunch: 950 kcal, 29g protein Dinner: 805 kcal, 32g protein Supplements: 920 kcal, 58g protein  Total intake: 3096 kcal (145% of minimum estimated needs)  130g protein (121% of minimum estimated needs)  Estimated Nutritional Needs:  Kcal:  9470-9628 kcals  Protein:  107-142 g  Nutrition Dx:  Malnutrition (Severe) related to chronic illness as evidenced by severe depletion of body fat, severe depletion of muscle mass, percent weight loss.  Goal: Pt to meet >/= 90% of their estimated nutrition needs   Intervention:  -D/c Calorie Count -Continue Pro-Stat BID -Continue Boost Plus BID  Clayton Bibles, MS, RD, LDN Pager: [phone] After Hours Pager: [phone]

## 2016-09-23 ENCOUNTER — Inpatient Hospital Stay (HOSPITAL_COMMUNITY): Payer: Medicare Other | Admitting: Speech Pathology

## 2016-09-23 ENCOUNTER — Inpatient Hospital Stay (HOSPITAL_COMMUNITY): Payer: Medicare Other | Admitting: Occupational Therapy

## 2016-09-23 ENCOUNTER — Inpatient Hospital Stay (HOSPITAL_COMMUNITY): Payer: Medicare Other

## 2016-09-23 LAB — GLUCOSE, CAPILLARY
GLUCOSE-CAPILLARY: 184 mg/dL — AB (ref 65–99)
GLUCOSE-CAPILLARY: 215 mg/dL — AB (ref 65–99)
Glucose-Capillary: 81 mg/dL (ref 65–99)
Glucose-Capillary: 100 mg/dL — ABNORMAL HIGH (ref 65–99)
Glucose-Capillary: 102 mg/dL — ABNORMAL HIGH (ref 65–99)

## 2016-09-23 MED ORDER — BOOST PLUS PO LIQD
237.0000 mL | Freq: Three times a day (TID) | ORAL | Status: DC
  Administered 2016-09-23 – 2016-09-24 (×3): 237 mL via ORAL
  Filled 2016-09-23 (×13): qty 237

## 2016-09-23 NOTE — Progress Notes (Signed)
Physical Therapy Session Note  Patient Details  Name: Gregory Hester MRN: [id number] Date of Birth: 1945/09/07  Today's Date: 09/23/2016 PT Individual Time: 0900-1005 PT Individual Time Calculation (min): 65 min   Short Term Goals: Week 1:  PT Short Term Goal 1 (Week 1): Pt will sit EOB x10 min with Mod A  PT Short Term Goal 2 (Week 1): Pt will remain OOB in TIS WC x2hr/day PT Short Term Goal 3 (Week 1): Pt will initiate WC propulsion in standard WC PT Short Term Goal 4 (Week 1): Pt will initiate leg loop training for bed mobility PT Short Term Goal 5 (Week 1): Pt will roll R/L with Mod A for LE management  Skilled Therapeutic Interventions/Progress Updates:    PT supine in bed upon PT arrival, agreeable to therapy tx. Pain 4/10 at beginning of session. Pt wearing gown, requested to get dressed. Rolling R/L x3 each for Lower body dressing with Mod A and multi-modal cues for technique. Pt only needs A with LE management, able to pull with bedrails.  In bed with HOB elevated pt donned shirt with min assist, rolled x 1 each direction to pull shirt down in the back. While rolling to place maxi move pad, noticed that wound dressing was starting to come off. RN notified and came in to change dressing. Pt rolled to R side with mod assist, pt with increased anxiety and difficulty breathing during dressing change. Provided emotional support, provided verbal cues for breathing technique. Pt's SpO2 dropped to 80%, RN instructed to put on supplemental O2 on 2L. Pt with increased pain now to 9/10, RN asked to give pain meds. Rolling x 1 to place maxi move pad with +2 assist, maxi move transfer to w/c.Pt left in tilt in space w/c, instructed wife on tilting for pressure relief. Needs in reach.   Therapy Documentation Precautions:  Precautions Precautions: Fall, Other (comment) Precaution Comments: paraplegia, Colostomy, PEG, Sacral wound vac,  Restrictions Weight Bearing Restrictions: No   See  Function Navigator for Current Functional Status.   Therapy/Group: Individual Therapy  Netta Corrigan, PT, DPT 09/23/2016, 12:05 PM

## 2016-09-23 NOTE — Progress Notes (Signed)
Occupational Therapy Note  Patient Details  Name: Gregory Hester MRN: [id number] Date of Birth: 1946-02-12  Today's Date: 09/23/2016 OT Individual Time: 1300-1357 OT Individual Time Calculation (min): 57 min   Pt c/o 5/10 pain in buttocks; RN aware and repositioned Individual Therapy  Pt resting in bed sidelying upon arrival with wife present.  Continued education with wife and pt.  Discussed bed positioning and self care.  Pt issued theraband for BUE therex and exercises demonstrated.  2kg ball left with pt for BUE therex.  Pt completed chest presses and overhead lifts with ball (3 X 8 each).  Continued discharge planning.  Pt remained in bed in sidelying position with all needs within reach and wife present.    Leotis Shames [facility] 09/23/2016, 2:00 PM

## 2016-09-23 NOTE — Progress Notes (Signed)
Gregory Hester PHYSICAL MEDICINE & REHABILITATION     PROGRESS NOTE  Subjective/Complaints:  Had questions about neurological return in legs. Happy that trach is out---no problems over night  ROS: pt denies nausea, vomiting, diarrhea, cough, shortness of breath or chest pain   Objective: Vital Signs: Blood pressure 131/72, pulse 90, temperature 98.3 F (36.8 C), temperature source Oral, resp. rate 17, height 5' 10.5" (1.791 m), weight 70.2 kg (154 lb 12.2 oz), SpO2 92 %. No results found. No results for input(s): WBC, HGB, HCT, PLT in the last 72 hours. No results for input(s): NA, K, CL, GLUCOSE, BUN, CREATININE, CALCIUM in the last 72 hours.  Invalid input(s): CO CBG (last 3)   Recent Labs  09/22/16 1707 09/22/16 2119 09/23/16 0548  GLUCAP 91 197* 81    Wt Readings from Last 3 Encounters:  09/23/16 70.2 kg (154 lb 12.2 oz)  09/17/16 66.7 kg (147 lb)    Physical Exam:  BP 131/72 (BP Location: Left Arm)   Pulse 90   Temp 98.3 F (36.8 C) (Oral)   Resp 17   Ht 5' 10.5" (1.791 m)   Wt 70.2 kg (154 lb 12.2 oz)   SpO2 92%   BMI 21.89 kg/m  Constitutional: He appears well-developedand well-nourished. NAD HENT: Normocephalicand atraumatic.  Eyes: EOMI. No discharge. Neck: stoma dressed. No audible leaking  Cardiovascular: RRR without murmur. No JVD  Respiratory:  Decreased sounds at bases. Normal effort GI: Soft. Bowel sounds are normal. +PEG. Colostomy patent. Genitourinary: Foley in place.  Musculoskeletal: He exhibits no edemaor tenderness.  Neurological: He is alert.  He is able to follow simple motor commands.  Motor: B/l UE 4/5 prox to distal.  B/l LE: 0/5 bilaterally without change. No resting tone.  Skin: Skin is warmand dry.  Sacral decub dressed, unchanged Psychiatric: impaired short term memory, anxious. .   Assessment/Plan: 1. 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 Physiatrist is providing close team supervision and 24 hour management of active medical problems listed below. Physiatrist and rehab team continue to assess barriers to discharge/monitor patient progress toward functional and medical goals.  Function:  Bathing Bathing position   Position: Bed  Bathing parts Body parts bathed by patient: Right arm, Left arm, Chest, Abdomen, Front perineal area, Right upper leg, Left upper leg Body parts bathed by helper: Right lower leg, Left lower leg  Bathing assist Assist Level:  (7/9, 78%, min assist level)      Upper Body Dressing/Undressing Upper body dressing   What is the patient wearing?: Pull over shirt/dress     Pull over shirt/dress - Perfomed by patient: Thread/unthread right sleeve, Thread/unthread left sleeve, Put head through opening Pull over shirt/dress - Perfomed by helper: Pull shirt over trunk        Upper body assist        Lower Body Dressing/Undressing Lower body dressing   What is the patient wearing?: Pants, Non-skid slipper socks     Pants- Performed by patient: Thread/unthread right pants leg, Thread/unthread left pants leg Pants- Performed by helper: Pull pants up/down (slinical reasoning as RN requesting no rolling at this time)   Non-skid slipper socks- Performed by helper: Don/doff right sock, Don/doff left sock (OT holding LE in figure 4)                  Lower body assist Assist for lower body dressing:  (MAX A)  Toileting Toileting Toileting activity did not occur: Safety/medical concerns (ostomy)        Toileting assist     Transfers Chair/bed transfer   Chair/bed transfer method: Other Chair/bed transfer assist level: dependent (Pt equals 0%) Chair/bed transfer assistive device: Mechanical lift (per PT documentation maximove used ) Mechanical lift: Maximove   Locomotion Ambulation Ambulation activity did not occur: Safety/medical concerns          Wheelchair       Assist Level: Dependent (Pt equals 0%)  Cognition Comprehension Comprehension assist level: Understands basic 90% of the time/cues < 10% of the time  Expression Expression assist level: Expresses basic 50 - 74% of the time/requires cueing 25 - 49% of the time. Needs to repeat parts of sentences., Expresses basic 25 - 49% of the time/requires cueing 50 - 75% of the time. Uses single words/gestures.  Social Interaction Social Interaction assist level: Interacts appropriately 50 - 74% of the time - May be physically or verbally inappropriate.  Problem Solving Problem solving assist level: Solves basic 50 - 74% of the time/requires cueing 25 - 49% of the time  Memory Memory assist level: Recognizes or recalls 50 - 74% of the time/requires cueing 25 - 49% of the time    Medical Problem List and Plan: 1. 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 Team conf today  -provided education regarding recovery/progrnosis today 2. DVT Prophylaxis/Anticoagulation: Pharmaceutical: Lovenox 3. Pain Management: Oxycodone prn 4. Mood: team to provide ego support. LCSW to follow for evaluation and support  Xanax 0.25 BID PRN started on 7/28 5. Neuropsych: This patient is not fully capable of making decisions on hisown behalf. 6. Skin/Wound Care: Wound VAC to sacral area--has completed antibiotic regimen for osteomyelitis. Boost every 15 minutes when in chair.  7. Fluids/Electrolytes/Nutrition: strict I/O. Continue protein supplements.   -intake improving 8. T2DM: Monitor BS ac/hs. Continue Lantus insulin with SSI for elevated BS.   -elevated in PM  -continue 10u lantus qhs, added 5u qam yesterday. Follow for pattern 9. Chronic combined systolic/diastolic CHF: Monitor for signs of overload. Check weight daily. Heart healthy diet. On hydralazine, losartan and imdur. Intolerant of statins.  Filed Weights   09/21/16 0500 09/22/16 0500 09/23/16 0407   Weight: 71 kg (156 lb 7 oz) 71.2 kg (157 lb) 70.2 kg (154 lb 12.2 oz)  10. Anxiety disorder: Continue buspar bid with Seroquel at nights. 11. Hypothyroid: On synthroid for supplement.   TSH elevated on 7/28, will consider increase in meds if persistently elevated   -recheck T4 levels along with TSH in a week or two 12. TBI: On modafinil for attention/activation?  13. ABLA: improving. Continue iron supplement.   Hb 9.6 on 7/28  Cont to monitor 14. Acute on chronic renal failure?: Added water flushes via PEG.   Cr. 1.05 on 7/28  Cont to monitor 15. Intermittent leucocytosis: Monitor for signs of infection. Most recently treated for Kleb UTI.   Recent ucx >100K yeast, cont to monitor for symptoms   -diflucan for 5 days 16. Stage IV sacral decub: Air mattress overlay.   -appreciate plastics/WOC RN recs  -changes to dressings made  - Added vitamin to promote healing.   -improved intake should help 17. Intermittent abdominal pain: CT scan 7/23 negative for obst or inflammation.  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 SOB likely psychogenic.   CXR reviewed, with likely atelectasis,    -decannulated 7.30, continue occlusive dressing 19. Urinary retention  Cont Foley  for wound 20. Hypokalemia  K+ 3.3 on 7/28  Supplemented x1 day  Follow up later this wek 21. Hypoalbuminemia  Supplement initiated 7/28   LOS (Days) 4 A FACE TO FACE EVALUATION WAS PERFORMED  SWARTZ,ZACHARY T 09/23/2016 8:54 AM

## 2016-09-23 NOTE — Consult Note (Addendum)
[place] Nurse wound follow up Refer to plastic surgery consult note from yesterday; their team is in agreement that Vac will be difficult to maintain a seal during frequent sliding back and forth for rehab activities related to the location. Silver Alginate was recommended to reduce drainage from wound bed.  Orders and instructions provided for bedside nurses to perform daily dressing changes to sacrum wound.  Discussed plan of care with patient at the bedside.  Recommend followup at the outpatient wound care center after discharge from rehab. WOC will continue to follow weekly for ostomy teaching. Julien Girt MSN, RN, [place], [place], [place]

## 2016-09-23 NOTE — Progress Notes (Signed)
Social Work  Social Work Assessment and Plan  Patient Details  Name: Gregory Hester MRN: [id number] Date of Birth: 02-25-1946  Today's Date: 09/23/2016  Problem List:  Patient Active Problem List   Diagnosis Date Noted  . Yeast infection   . Anxiety state   . Hypoalbuminemia due to protein-calorie malnutrition ([name])   . Hypokalemia   . Urinary retention   . Decubitus ulcer of sacral region, stage 4 ([name])   . AKI (acute kidney injury) ([name])   . Hypothyroidism   . Diabetes mellitus type 2 in nonobese (HCC)   . Spinal cord injury, T1-T6 ([name]) 09/20/2016  . Neurogenic bowel 08/24/2016  . Neurogenic bladder 08/28/2016  . Tracheostomy status ([name]) 09/12/2016  . Colostomy status ([name]) 08/29/2016   Past Medical History:  Past Medical History:  Diagnosis Date  . Bursitis of hip, right   . Chronic combined systolic (congestive) and diastolic (congestive) heart failure ([name])   . Diabetes mellitus ([name])   . HTN (hypertension)   . Hypospadias   . Hypothyroid   . Osteomyelitis of sacrum ([name])   . Sacral decubitus ulcer, stage IV ([name])   . Thrombocytosis ([name])    Past Surgical History:  Past Surgical History:  Procedure Laterality Date  . BACK SURGERY  2015  . CERVICAL SPINE SURGERY    . CORONARY ARTERY BYPASS GRAFT  2014  . HERNIA REPAIR    . TOTAL HIP ARTHROPLASTY Right   . TOTAL HIP ARTHROPLASTY  left   Social History:  reports that he has never smoked. He has never used smokeless tobacco. He reports that he does not drink alcohol or use drugs.  Family / Support Systems Marital Status: Married How Long?: x17 yrs (2nd marriage for both) Patient Roles: Spouse, Parent Spouse/Significant Other: [name] @ [phone] Children: Pt has 5 adult children and wife has 2 - closest one is pt's daughter in [name] Anticipated Caregiver: wife Ability/Limitations of Caregiver: Wife is not working.  Wife can assist. Caregiver Availability: 24/7 Family Dynamics: Pt reports  that he and wife have a good relationship with all children, however, "they're young and have their own families and jobs."  Do not anticipate any of them providing any assist.  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  He talks openly about he physical and emotional reaction when wound care is being done and describes, "I just flip out! I go into a different place because of the pain!Marland Kitchen and they don't seem to care."  Allowed him to talk about feeling unheard and questions some way to medicate prior to txs.  Will make MD aware of his concerns.  He acknowledges that, given his lengthy hospitalization, one would think he would be experiencing some depression.  He denies depression and notes his panic about wound care is primary emotional distressor.   Recent Psychosocial Issues: None prior to this injury/ hospitalization. Pyschiatric History: None Substance Abuse History: None  Patient /  Family Perceptions, Expectations & Goals Pt/Family understanding of illness & functional limitations: Pt and wife with good understanding of the multiple medical issues he has going on.  Good understanding of functional limitations and need for CIR. Premorbid pt/family roles/activities: Pt was completely independent and working f/t initial admit in  Feb. Anticipated changes in roles/activities/participation: Wife will need to assume caregiver role as pt expected to require significant assistance at d/c. Pt/family expectations/goals: Pt admits he is not sure what to expect in terms of gains here on CIR or plans for d/c.  Community Resources Express Scripts: None Premorbid Home Care/DME Agencies: None Transportation available at discharge: yes Resource referrals recommended: Neuropsychology  Discharge Planning Living Arrangements: Spouse/significant other Support Systems: Spouse/significant other, Children, Immunologist, Friends/neighbors Type of Residence: Private residence Insurance Resources: [employer] [employer]) Museum/gallery curator Resources: Employment Museum/gallery curator Screen Referred: No Living Expenses: Higher education careers adviser Management: Patient Does the patient have any problems obtaining your medications?: No Home Management: pt and wife Patient/Family Preliminary Plans: As noted, pt and wife uncertain of d/c plan at this time and note SNF may still need to be a plan dependent on gains here on CIR. Sw Barriers to Discharge: Home environment access/layout, Wound Care Sw Barriers to Discharge Comments: pt's care needs may remain too great for wife to manage at home Social Work Anticipated Follow Up Needs: HH/OP, SNF Expected length of stay: 21-24 days  Clinical Impression Very unfortunate gentleman who has been hospitalized since fall in Feb at various levels of care including acute care, SNF, LTACH and now CIR.  Significant wound care needs and this causes pt much anxiety due to pain and is his primary focus in conversation.  Wife is very supportive and able to provide 24/7 care, however, his care needs at end of CIR stay may still be too much and option of SNF may need to be considered.  Will follow closely for support and d/c planning needs.  [name] 09/23/2016, 4:43 PM

## 2016-09-23 NOTE — Care Management Note (Signed)
[facility] Individual Statement of Services  Patient Name:  CECIL VANDYKE  Date:  09/23/2016  Welcome to the [facility].  Our goal is to provide you with an individualized program based on your diagnosis and situation, designed to meet your specific needs.  With this comprehensive rehabilitation program, you will be expected to participate in at least 3 hours of rehabilitation therapies Monday-Friday, with modified therapy programming on the weekends.  Your rehabilitation program will include the following services:  Physical Therapy (PT), Occupational Therapy (OT), Speech Therapy (ST), 24 hour per day rehabilitation nursing, Therapeutic Recreaction (TR), Neuropsychology, Case Management (Social Worker), Rehabilitation Medicine, Nutrition Services and Pharmacy Services  Weekly team conferences will be held on Tuesdays to discuss your progress.  Your Social Worker will talk with you frequently to get your input and to update you on team discussions.  Team conferences with you and your family in attendance may also be held.  Expected length of stay: 3 weeks Overall anticipated outcome: moderate assistance  Depending on your progress and recovery, your program may change. Your Social Worker will coordinate services and will keep you informed of any changes. Your Social Worker's name and contact numbers are listed  below.  The following services may also be recommended but are not provided by the [facility] will be made to provide these services after discharge if needed.  Arrangements include referral to agencies that provide these services.  Your insurance has been verified to be:  [facility] Medicare Your primary doctor is:  Dr. Joseph Art ([place], [place])  Pertinent information will be shared  with your doctor and your insurance company.  Social Worker:  [place], [place] or [phone]   Information discussed with and copy given to patient by: Lennart Pall, 09/23/2016, 4:42 PM

## 2016-09-23 NOTE — Progress Notes (Signed)
Occupational Therapy Session Note  Patient Details  Name: Gregory Hester MRN: [id number] Date of Birth: [birth year]  Today's Date: 09/23/2016 OT Individual Time: 1045-1200 OT Individual Time Calculation (min): 75 min    Short Term Goals: Week 1:  OT Short Term Goal 1 (Week 1): Pt will sit EOM/EOB 10 min with MOD A for sitting balance OT Short Term Goal 2 (Week 1): Pt will don pull over shirt with touching A for balance OT Short Term Goal 3 (Week 1): Pt will participate in 5 min of functional activity without a rest break OT Short Term Goal 4 (Week 1): Pt will transfer to chair with MAX A of 2  Skilled Therapeutic Interventions/Progress Updates:    1:1 Pt in pain when arrived from previous therapy session. Pt willing to participate but with encouragement. Pt did demonstrated decr short term and immediate recall throughout session. Pt participated in brushing teeth and shaving at the sink with focus on tolerance to sitting upright in the chair, balance reactions and ability to use upper body to pull self forward.  Also worked towards establishing rapport with pt and discussion of OT's role and purpose and goals. Pt lifted up in the left to remove chuck pad for better pressure relief in the cushion. Also demonstrated the tilt table as an option to try tomorrow for upright posture for breathing and getting off back side more.  Took pt outside with wife present while discussion home situation and d/c plans. Pt returned to bed via lift with wife's assist.  According to Gregory Hester the PA Pt needs to remain off his bottom while not in therapy and in sidelying.  Discussed and pt assisted with positioning into sidelying with bed rails while therapist assisted with LB.   Therapy Documentation Precautions:  Precautions Precautions: Fall, Other (comment) Precaution Comments: paraplegia, Colostomy, PEG, Sacral wound vac,  Restrictions Weight Bearing Restrictions: No    Vital Signs: Oxygen Therapy SpO2: 96  % O2 Device: Not Delivered Pain: Pain Assessment Pain Score: 9  Pain Type: Acute pain Pain Location: Abdomen (also headache) Pain Descriptors / Indicators: Aching;Discomfort Patients Stated Pain Goal: 2 Pain Intervention(s): Medication (See eMAR)  See Function Navigator for Current Functional Status.   Therapy/Group: Individual Therapy  [name] [facility] 09/23/2016, 12:19 PM

## 2016-09-23 NOTE — Progress Notes (Signed)
Speech Language Pathology Daily Session Note  Patient Details  Name: Gregory Hester MRN: [id number] Date of Birth: [birth year]  Today's Date: 09/23/2016 SLP Individual Time: 0815-0900 SLP Individual Time Calculation (min): 45 min  Short Term Goals: Week 1: SLP Short Term Goal 1 (Week 1): Pt will consume therapeutic trials of ice chips with minimal overt s/s of aspiration and mod I use of swallowing precautions over 3 consecutive sessions prior to repeat objective assessment.   SLP Short Term Goal 2 (Week 1): Pt will consume dys 3 textures and nectar thick liquids with mod I use of swallowing precautions and minimal overt s/s of aspiration.   SLP Short Term Goal 3 (Week 1): Pt will utilize external aids to recall daily information with min assist verbal cues.  SLP Short Term Goal 4 (Week 1): Pt will complete basic, familiar tasks with min assist for functional problem solving.   SLP Short Term Goal 5 (Week 1): Pt will increase vocal intensity to achieve intelligibility during conversations in noisy environments with supervision verbal cues.    Skilled Therapeutic Interventions: Skilled treatment session focused on dysphagia and cognitive goals. SLP facilitated session by providing Min A verbal cues for proper positioning and to sustain attention to meal and decrease talking during breakfast meal of Dys. 3 textures with nectar-thick liquids. Patient consumed meal without overt s/s of aspiration. Patient decannulated yesterday and demonstrated an increased vocal intensity but continues to require supervision verbal cues for intelligibility at the sentence level to achieve 100% intelligibility. Patient left upright in bed with RN present. Continue with current plan of care.      Function:  Eating Eating   Modified Consistency Diet: Yes Eating Assist Level: Supervision or verbal cues;Set up assist for   Eating Set Up Assist For: Opening containers       Cognition Comprehension Comprehension  assist level: Understands basic 90% of the time/cues < 10% of the time  Expression   Expression assist level: Expresses basic 50 - 74% of the time/requires cueing 25 - 49% of the time. Needs to repeat parts of sentences.;Expresses basic 25 - 49% of the time/requires cueing 50 - 75% of the time. Uses single words/gestures.  Social Interaction Social Interaction assist level: Interacts appropriately 50 - 74% of the time - May be physically or verbally inappropriate.  Problem Solving Problem solving assist level: Solves basic 50 - 74% of the time/requires cueing 25 - 49% of the time  Memory Memory assist level: Recognizes or recalls 50 - 74% of the time/requires cueing 25 - 49% of the time    Pain No reports of pain   Therapy/Group: Individual Therapy  Yaphet Smethurst 09/23/2016, 12:09 PM

## 2016-09-24 ENCOUNTER — Inpatient Hospital Stay (HOSPITAL_COMMUNITY): Payer: Medicare Other | Admitting: Physical Therapy

## 2016-09-24 ENCOUNTER — Inpatient Hospital Stay (HOSPITAL_COMMUNITY): Payer: Medicare Other | Admitting: Speech Pathology

## 2016-09-24 ENCOUNTER — Inpatient Hospital Stay (HOSPITAL_COMMUNITY): Payer: Medicare Other

## 2016-09-24 ENCOUNTER — Inpatient Hospital Stay (HOSPITAL_COMMUNITY): Payer: Medicare Other | Admitting: Occupational Therapy

## 2016-09-24 DIAGNOSIS — R410 Disorientation, unspecified: Secondary | ICD-10-CM

## 2016-09-24 LAB — COMPREHENSIVE METABOLIC PANEL
ALBUMIN: 2.4 g/dL — AB (ref 3.5–5.0)
ALT: 11 U/L — ABNORMAL LOW (ref 17–63)
AST: 16 U/L (ref 15–41)
Alkaline Phosphatase: 97 U/L (ref 38–126)
Anion gap: 10 (ref 5–15)
BUN: 32 mg/dL — AB (ref 6–20)
CHLORIDE: 101 mmol/L (ref 101–111)
CO2: 26 mmol/L (ref 22–32)
Calcium: 9.4 mg/dL (ref 8.9–10.3)
Creatinine, Ser: 1.19 mg/dL (ref 0.61–1.24)
GFR calc Af Amer: >60 mL/min (ref >60)
GFR calc non Af Amer: >60 mL/min (ref >60)
GLUCOSE: 118 mg/dL — AB (ref 65–99)
POTASSIUM: 4.2 mmol/L (ref 3.5–5.1)
SODIUM: 137 mmol/L (ref 135–145)
Total Bilirubin: 0.5 mg/dL (ref 0.3–1.2)
Total Protein: 6.5 g/dL (ref 6.5–8.1)

## 2016-09-24 LAB — URINALYSIS, ROUTINE W REFLEX MICROSCOPIC
BACTERIA UA: NONE SEEN
Bilirubin Urine: NEGATIVE
Glucose, UA: 50 mg/dL — AB
HGB URINE DIPSTICK: NEGATIVE
Ketones, ur: NEGATIVE mg/dL
NITRITE: NEGATIVE
PROTEIN: 100 mg/dL — AB
Specific Gravity, Urine: 1.019 (ref 1.005–1.030)
Squamous Epithelial / LPF: NONE SEEN
pH: 5 (ref 5.0–8.0)

## 2016-09-24 LAB — GLUCOSE, CAPILLARY
GLUCOSE-CAPILLARY: 98 mg/dL (ref 65–99)
Glucose-Capillary: 177 mg/dL — ABNORMAL HIGH (ref 65–99)
Glucose-Capillary: 184 mg/dL — ABNORMAL HIGH (ref 65–99)
Glucose-Capillary: 254 mg/dL — ABNORMAL HIGH (ref 65–99)

## 2016-09-24 LAB — CBC
HEMATOCRIT: 32.9 % — AB (ref 39.0–52.0)
Hemoglobin: 10.1 g/dL — ABNORMAL LOW (ref 13.0–17.0)
MCH: 26.4 pg (ref 26.0–34.0)
MCHC: 30.7 g/dL (ref 30.0–36.0)
MCV: 85.9 fL (ref 78.0–100.0)
Platelets: 419 10*3/uL — ABNORMAL HIGH (ref 150–400)
RBC: 3.83 MIL/uL — ABNORMAL LOW (ref 4.22–5.81)
RDW: 17.4 % — AB (ref 11.5–15.5)
WBC: 20.1 10*3/uL — AB (ref 4.0–10.5)

## 2016-09-24 LAB — C DIFFICILE QUICK SCREEN W PCR REFLEX
C DIFFICILE (CDIFF) INTERP: NOT DETECTED
C Diff antigen: NEGATIVE
C Diff toxin: NEGATIVE

## 2016-09-24 MED ORDER — PIPERACILLIN-TAZOBACTAM 3.375 G IVPB
3.3750 g | Freq: Three times a day (TID) | INTRAVENOUS | Status: DC
  Administered 2016-09-24: 3.375 g via INTRAVENOUS
  Filled 2016-09-24 (×3): qty 50

## 2016-09-24 MED ORDER — IPRATROPIUM-ALBUTEROL 0.5-2.5 (3) MG/3ML IN SOLN
3.0000 mL | RESPIRATORY_TRACT | Status: DC | PRN
  Administered 2016-09-24: 3 mL via RESPIRATORY_TRACT
  Filled 2016-09-24: qty 3

## 2016-09-24 MED ORDER — ALPRAZOLAM 0.25 MG PO TABS
0.2500 mg | ORAL_TABLET | Freq: Two times a day (BID) | ORAL | Status: DC | PRN
  Administered 2016-09-25: 0.25 mg via ORAL
  Filled 2016-09-24: qty 1

## 2016-09-24 MED ORDER — INSULIN GLARGINE 100 UNIT/ML ~~LOC~~ SOLN
5.0000 [IU] | Freq: Every day | SUBCUTANEOUS | Status: DC
  Administered 2016-09-24: 5 [IU] via SUBCUTANEOUS
  Filled 2016-09-24: qty 0.05

## 2016-09-24 MED ORDER — INSULIN GLARGINE 100 UNIT/ML ~~LOC~~ SOLN
10.0000 [IU] | Freq: Every day | SUBCUTANEOUS | Status: DC
  Filled 2016-09-24: qty 0.1

## 2016-09-24 MED ORDER — IOPAMIDOL (ISOVUE-300) INJECTION 61%
INTRAVENOUS | Status: AC
  Administered 2016-09-24: 75 mL
  Filled 2016-09-24: qty 75

## 2016-09-24 MED ORDER — BUDESONIDE 0.25 MG/2ML IN SUSP
0.2500 mg | Freq: Two times a day (BID) | RESPIRATORY_TRACT | Status: DC
  Administered 2016-09-24: 0.25 mg via RESPIRATORY_TRACT
  Filled 2016-09-24: qty 2

## 2016-09-24 MED ORDER — ALPRAZOLAM 0.25 MG PO TABS: 0.2500 mg | ORAL_TABLET | Freq: Two times a day (BID) | ORAL | Status: DC

## 2016-09-24 NOTE — Progress Notes (Signed)
Occupational Therapy Session Note  Patient Details  Name: Gregory Hester MRN: [id number] Date of Birth: 1946-01-13  Today's Date: 09/24/2016 OT Individual Time: 0930-1030 OT Individual Time Calculation (min): 60 min    Short Term Goals: Week 1:  OT Short Term Goal 1 (Week 1): Pt will sit EOM/EOB 10 min with MOD A for sitting balance OT Short Term Goal 2 (Week 1): Pt will don pull over shirt with touching A for balance OT Short Term Goal 3 (Week 1): Pt will participate in 5 min of functional activity without a rest break OT Short Term Goal 4 (Week 1): Pt will transfer to chair with MAX A of 2  Skilled Therapeutic Interventions/Progress Updates:    Pt resting in bed upon arrival with wife present.  Focus on bed mobility, bathing/dressing tasks at bed level, sitting balance, and transfer to TIS w/c with [employer].  Pt required min A for UB bathing/dressing tasks and max A for LB bathing/dressing tasks.  Pt required tot A + 2 for supine>sit EOB and tot A for sitting balance at EOB.  Pt required tot A + 2 for transfer to w/c.  Pt noted with increased anxiety with all transitional movements and required max verbal cues for pursed lip breathing.  Pt initially on RA but required 2L O2 during session.  Pt remained in TIS w/c with wife present.   Therapy Documentation Precautions:  Precautions Precautions: Fall, Other (comment) Precaution Comments: paraplegia, Colostomy, PEG, Sacral wound vac,  Restrictions Weight Bearing Restrictions: No Pain: Pain Assessment Pain Assessment: 0-10 Pain Score: 8  Pain Location: Buttocks Pain Orientation: Mid Pain Onset: On-going Patients Stated Pain Goal: 6    See Function Navigator for Current Functional Status.   Therapy/Group: Individual Therapy  Leroy Libman 09/24/2016, 1:29 PM

## 2016-09-24 NOTE — Progress Notes (Signed)
Occupational Therapy Session Note  Patient Details  Name: Gregory Hester MRN: [id number] Date of Birth: 1945-05-22  Today's Date: 09/24/2016 OT Individual Time: 1130-1200 OT Individual Time Calculation (min): 30 min    Short Term Goals: Week 1:  OT Short Term Goal 1 (Week 1): Pt will sit EOM/EOB 10 min with MOD A for sitting balance OT Short Term Goal 2 (Week 1): Pt will don pull over shirt with touching A for balance OT Short Term Goal 3 (Week 1): Pt will participate in 5 min of functional activity without a rest break OT Short Term Goal 4 (Week 1): Pt will transfer to chair with MAX A of 2  Skilled Therapeutic Interventions/Progress Updates:    1:1 Pt received in the tilt in space w/c. Pt agreeable to try the tilt table for upright posture for increased cardiopulmonary endurance, LB weight bearing, etc. Pt transferred via maxi lift.  Once pt place on the table (flat) pt SOB with difficulty phonating - however with table tilted more upright pt with some relief.  Pt felt he had mucus in this throat but unable to cough forcefully enough to clear.  Pt required supplemental O2 in session due to desat to 80% on RA.  Returned to 95% on 2 liters.   Pt able to tolerate 50 degrees with BP 115/66 ~10 min  But when increased to 60 degrees BP dropped to 84/72   REturned to supine and back into room all transfers via maxi move.  Pt again with difficuly with phonation and SOB with supine today - RN in the room and assessing pt.  RT called.    Therapy Documentation Precautions:  Precautions Precautions: Fall, Other (comment) Precaution Comments: paraplegia, Colostomy, PEG, Sacral wound vac,  Restrictions Weight Bearing Restrictions: No Pain: Pt did not c/o pain in session today  See Function Navigator for Current Functional Status.   Therapy/Group: Individual Therapy  [name] [facility] 09/24/2016, 1:43 PM

## 2016-09-24 NOTE — Progress Notes (Addendum)
Pt returned from therapy with little resp effort noted reported "hard to breathe" O2 at @l /Min [place] or sat in high 80's during therapy. Returned to bed and redressed sacral wound. [name] Love notified of changes and respiratory paged to see pt who was recently decannulated from trach. Congested rhonchi on rt side and decreased breath sounds left side. Respiratory notified of new orders. Urine culture and stool specimen obtained per order. Margarito Liner

## 2016-09-24 NOTE — Patient Care Conference (Signed)
Inpatient RehabilitationTeam Conference and Plan of Care Update Date: 09/23/2016   Time: 2:25 PM    Patient Name: Gregory Hester      Medical Record Number: [id number]  Date of Birth: 1945/04/06 Sex: Male         Room/Bed: 4W07C/4W07C-01 Payor Info: Payor: Theme park manager MEDICARE / Plan: [employer] MEDICARE / Product Type: *No Product type* /    Admitting Diagnosis: SCI  Admit Date/Time:  09/04/2016  2:42 PM Admission Comments: No comment available   Primary Diagnosis:  Spinal cord injury, T1-T6 ([place]) Principal Problem: Spinal cord injury, T1-T6 ([place])  Patient Active Problem List   Diagnosis Date Noted  . Yeast infection   . Anxiety state   . Hypoalbuminemia due to protein-calorie malnutrition ([place])   . Hypokalemia   . Urinary retention   . Decubitus ulcer of sacral region, stage 4 ([place])   . AKI (acute kidney injury) ([place])   . Hypothyroidism   . Diabetes mellitus type 2 in nonobese ([facility])   . Spinal cord injury, T1-T6 ([place]) 09/12/2016  . Neurogenic bowel 08/24/2016  . Neurogenic bladder 09/18/2016  . Tracheostomy status ([place]) 09/16/2016  . Colostomy status ([place]) 09/08/2016    Expected Discharge Date: Expected Discharge Date: 10/11/16  Team Members Present: Physician leading conference: Dr. Alger Simons Social Worker Present: Lennart Pall, LCSW Nurse Present: Dorien Chihuahua, RN PT Present: Canary Brim, PT OT Present: [name], OT;Roanna Epley, COTA SLP Present: Weston Anna, SLP PPS Coordinator present : Daiva Nakayama, RN, CRRN     Current Status/Progress Goal Weekly Team Focus  Medical   T5 sci with paraplegia motor complete. trach. large stage 4 sacral wound.   improve activity tolerance  skin care, trach mgt   Bowel/Bladder   Foley catheter, ostomy for stool  total assist with equipment  foley care bid, ostomy care prn   Swallow/Nutrition/ Hydration   full supervision with dysphagia 3 and nectar thick liquids  Mod I with least restricitive diet  education on overt  s/s of aspiration and need for instrumental swallow study before liquid upgrade   ADL's   max A/tot A overall; increased anxiety with transitional movements  mod A overall; bathing-min A  activity tolerance, sitting balance, bed mobility, BADL retraining   Mobility   total A maxi move for transfers, + 2 rolling and bed mobility  mod A transfers, w/c level  activity tolerance, sitting balance   Communication   Max A to increase vocal intensity at the word phrase level ~ 75% intelligibility  Mod I at the simple conversation   introduction and carry over of speech intelligibility strategies   Safety/Cognition/ Behavioral Observations  Mod A for basic problem, safety awareness  Supervision  problem solving, memory and intellectual awareness   Pain   occasional pain to sacral wound or headaches from lying flat, anxiety and increased pain with stage 4 dressing change to sacral area.  pain <2 with mod assist  monitor pain q shift and prn   Skin   stage 4 to sacral area, wet to dry dressing, right hip stage 1 , air mattress, foley to keep dry  wound care bid total assist  monitor skin q shift and prn for any s/s of infection, healing or worsening of wounds, encourage pt to  turnt q 2 hours,     Rehab Goals Patient on target to meet rehab goals: Yes *See Care Plan and progress notes for long and short-term goals.     Barriers to Discharge  Current Status/Progress Possible Resolutions Date Resolved   Physician    Trach;Medical stability        trach removal. wound care      Nursing                  PT  Inaccessible home environment;Medical stability;Home environment access/layout;Trach;Neurogenic Bowel & Bladder;Wound Care                 OT Medical stability;Wound Care                SLP                SW Home environment access/layout;Wound Care pt's care needs may remain too great for wife to manage at home            Discharge Planning/Teaching Needs:  Plan to d/c home with family  providing 24/7 assistance.  Teaching to be planned closer to d/c.   Team Discussion:  Multiple medical issues;  VAC would not seal so this was d/c'd.  Wound care a major management issues and much concern about how to complete rehab while protecting wound and promoting healing.  Needs to be on side when in bed and not in txs.  PT working on best cushion.  Lurline Idol out.  STM poor.  SW notes may have to consider SNF if care needs too great for wife.  Revisions to Treatment Plan:  Not at this time.    Continued Need for Acute Rehabilitation Level of Care: The patient requires daily medical management by a physician with specialized training in physical medicine and rehabilitation for the following conditions: Daily direction of a multidisciplinary physical rehabilitation program to ensure safe treatment while eliciting the highest outcome that is of practical value to the patient.: Yes Daily medical management of patient stability for increased activity during participation in an intensive rehabilitation regime.: Yes Daily analysis of laboratory values and/or radiology reports with any subsequent need for medication adjustment of medical intervention for : Post surgical problems;Neurological problems  Quang Thorpe 09/24/2016, 10:54 AM

## 2016-09-24 NOTE — Plan of Care (Signed)
Pt's plan of care adjusted to 3hrs after speaking with care team and discussed with PA  as pt currently unable to tolerate current therapy schedule with OT, PT, and SLP - with the goal to work up to 4 hrs as he can tolerate.

## 2016-09-24 NOTE — Progress Notes (Signed)
Physical Therapy Session Note  Patient Details  Name: Gregory Hester MRN: [id number] Date of Birth: 04-05-1945   Skilled Therapeutic Interventions/Progress Updates: Pt off unit for CXR, missed 60 min skilled therapy. Will continue efforts.      Therapy Documentation Precautions:  Precautions Precautions: Fall, Other (comment) Precaution Comments: paraplegia, Colostomy, PEG, Sacral wound vac,  Restrictions Weight Bearing Restrictions: No General: PT Amount of Missed Time (min): 60 Minutes PT Missed Treatment Reason: Unavailable (Comment);Xray Vital Signs: Therapy Vitals Pulse Rate: 87 Resp: (!) 24 BP: (!) 116/91 Patient Position (if appropriate): Lying Oxygen Therapy SpO2: 96 % O2 Device: Nasal Cannula O2 Flow Rate (L/min):  (2L/[place]) Pain: Pain Assessment Pain Assessment: 0-10 Pain Score: 8  Pain Location: Buttocks Pain Orientation: Mid Pain Onset: On-going Patients Stated Pain Goal: 6      See Function Navigator for Current Functional Status.   Therapy/Group: Individual Therapy  Gregory Hester  Gregory Hester, PTA   09/24/2016, 1:10 PM

## 2016-09-24 NOTE — Progress Notes (Signed)
[facility] PHYSICAL MEDICINE & REHABILITATION     PROGRESS NOTE  Subjective/Complaints:  Pt more anxious/confused. Pt recognized me when I entered room. Had questions about trach dressing  ROS: Limited due to cognitive/behavioral    Objective: Vital Signs: Blood pressure 136/79, pulse 87, temperature 98.2 F (36.8 C), temperature source Oral, resp. rate 18, height 5' 10.5" (1.791 m), weight 67.6 kg (149 lb), SpO2 96 %. No results found. No results for input(s): WBC, HGB, HCT, PLT in the last 72 hours. No results for input(s): NA, K, CL, GLUCOSE, BUN, CREATININE, CALCIUM in the last 72 hours.  Invalid input(s): CO CBG (last 3)   Recent Labs  09/23/16 1608 09/23/16 2151 09/24/16 0637  GLUCAP 184* 215* 98    Wt Readings from Last 3 Encounters:  09/24/16 67.6 kg (149 lb)  09/17/16 66.7 kg (147 lb)    Physical Exam:  BP 136/79 (BP Location: Right Arm)   Pulse 87   Temp 98.2 F (36.8 C) (Oral)   Resp 18   Ht 5' 10.5" (1.791 m)   Wt 67.6 kg (149 lb)   SpO2 96%   BMI 21.08 kg/m  Constitutional: He appears well-developedand well-nourished. NAD HENT: Normocephalicand atraumatic.  Eyes: EOMI. No discharge. Neck: stoma closing with granulation  Cardiovascular: RRR without murmur. No JVD   Respiratory:  Normal effort. Voice weak. decr sounds in bases GI: Soft. Bowel sounds are normal. +PEG. Colostomy patent. Genitourinary: Foley in place.  Musculoskeletal: He exhibits no edemaor tenderness.  Neurological: He is alert but distracted. Can follow simple commands and answers simple questions but quickly goes off track   Motor: B/l UE 4/5 prox to distal.  B/l LE: 0/5 bilaterally without change. No resting tone.  Skin: Skin is warmand dry.  Sacral decubunchanged Psychiatric:  anxious. .   Assessment/Plan: 1. 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 Physiatrist is  providing close team supervision and 24 hour management of active medical problems listed below. Physiatrist and rehab team continue to assess barriers to discharge/monitor patient progress toward functional and medical goals.  Function:  Bathing Bathing position   Position: Bed  Bathing parts Body parts bathed by patient: Right arm, Left arm, Chest, Abdomen, Front perineal area, Right upper leg, Left upper leg Body parts bathed by helper: Right lower leg, Left lower leg  Bathing assist Assist Level:  (7/9, 78%, min assist level)      Upper Body Dressing/Undressing Upper body dressing   What is the patient wearing?: Pull over shirt/dress     Pull over shirt/dress - Perfomed by patient: Thread/unthread right sleeve, Thread/unthread left sleeve, Put head through opening Pull over shirt/dress - Perfomed by helper: Pull shirt over trunk        Upper body assist        Lower Body Dressing/Undressing Lower body dressing   What is the patient wearing?: Pants, Non-skid slipper socks     Pants- Performed by patient: Thread/unthread right pants leg, Thread/unthread left pants leg Pants- Performed by helper: Pull pants up/down (slinical reasoning as RN requesting no rolling at this time)   Non-skid slipper socks- Performed by helper: Don/doff right sock, Don/doff left sock (OT holding LE in figure 4)                  Lower body assist Assist for lower body dressing:  (MAX A)      Toileting Toileting Toileting activity  did not occur: Safety/medical concerns (ostomy)        Toileting assist     Transfers Chair/bed transfer   Chair/bed transfer method: Other (maxi move) Chair/bed transfer assist level: dependent (Pt equals 0%) Chair/bed transfer assistive device: Mechanical lift Mechanical lift: Maximove   Locomotion Ambulation Ambulation activity did not occur: Safety/medical concerns         Wheelchair       Assist Level: Dependent (Pt equals 0%)   Cognition Comprehension Comprehension assist level: Understands basic 75 - 89% of the time/ requires cueing 10 - 24% of the time  Expression Expression assist level: Expresses basic 50 - 74% of the time/requires cueing 25 - 49% of the time. Needs to repeat parts of sentences.  Social Interaction Social Interaction assist level: Interacts appropriately 50 - 74% of the time - May be physically or verbally inappropriate.  Problem Solving Problem solving assist level: Solves basic 50 - 74% of the time/requires cueing 25 - 49% of the time  Memory Memory assist level: Recognizes or recalls 50 - 74% of the time/requires cueing 25 - 49% of the time    Medical Problem List and Plan: 1. 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  2. DVT Prophylaxis/Anticoagulation: Pharmaceutical: Lovenox 3. Pain Management: Oxycodone prn 4. Mood: team to provide ego support. LCSW to follow for evaluation and support  Xanax reduced back to 0.25mg  5. Neuropsych: This patient is not fully capable of making decisions on hisown behalf. 6. Skin/Wound Care: Wound VAC to sacral area--has completed antibiotic regimen for osteomyelitis. Boost every 15 minutes when in chair.  7. Fluids/Electrolytes/Nutrition: strict I/O. Continue protein supplements.   -intake improving 8. T2DM: Monitor BS ac/hs. Continue Lantus insulin with SSI for elevated BS.   -elevated in PM, low AM  -change qhs lantus to 5u, increase qam to 10u  9. Chronic combined systolic/diastolic CHF: Monitor for signs of overload. Check weight daily. Heart healthy diet. On hydralazine, losartan and imdur. Intolerant of statins.  Filed Weights   09/22/16 0500 09/23/16 0407 09/24/16 0415  Weight: 71.2 kg (157 lb) 70.2 kg (154 lb 12.2 oz) 67.6 kg (149 lb)  10. Anxiety disorder: Continue buspar bid with Seroquel at nights. 11. Hypothyroid: On synthroid for supplement.   TSH elevated on 7/28, will consider increase in meds if  persistently elevated   -recheck T4 levels along with TSH in near future 12. TBI: On modafinil for attention/activation?  13. ABLA: improving. Continue iron supplement.   Hb 9.6 on 7/28  Cont to monitor 14. Acute on chronic renal failure?: Added water flushes via PEG.   Cr. 1.05 on 7/28  Cont to monitor 15. Intermittent leucocytosis: Monitor for signs of infection. Most recently treated for Kleb UTI.   Recent ucx >100K yeast, cont to monitor for symptoms   -diflucan for 5 days 16. Stage IV sacral decub: Air mattress overlay.   -appreciate plastics/WOC RN recs  -changes to dressings made  - Added vitamin to promote healing.   -improved intake should help 17. Intermittent abdominal pain: CT scan 7/23 negative for obst or inflammation.  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 SOB likely psychogenic.   CXR reviewed, with likely atelectasis,    -decannulated 7.30, continue occlusive dressing 19. Urinary retention  Cont Foley for wound 20. Hypokalemia  K+ 3.3 on 7/28  Supplemented x1 day  Follow up later this wek 21. Hypoalbuminemia  Supplement initiated 7/28 22. AMS  -pt is more confused. Is alert,  anxious  -check cmet,cbc   -fungal UTI being rx'ed  -lungs sound good, trach out  -wound care as above  -decr xanax dose  -consider checking TSH/cortisol level in AM   LOS (Days) 5 A FACE TO FACE EVALUATION WAS PERFORMED  SWARTZ,ZACHARY T 09/24/2016 9:01 AM

## 2016-09-24 NOTE — Progress Notes (Signed)
SLP Cancellation Note  Patient Details Name: Gregory Hester MRN: [id number] DOB: 08-01-1945   Cancelled treatment:       Patient missed 45 minutes of skilled SLP intervention due to not feeling well. Patient appeared to have increased work of breathing and appeared SOB while talking. RN aware and RT arrived to administer a breathing treatment.                                                                                                 Whitley Strycharz 09/24/2016, 3:25 PM

## 2016-09-24 DEATH — deceased

## 2016-09-25 ENCOUNTER — Inpatient Hospital Stay (HOSPITAL_COMMUNITY): Payer: Medicare Other | Admitting: Physical Therapy

## 2016-09-25 ENCOUNTER — Inpatient Hospital Stay (HOSPITAL_COMMUNITY): Payer: Medicare Other | Admitting: Speech Pathology

## 2016-09-25 ENCOUNTER — Inpatient Hospital Stay (HOSPITAL_COMMUNITY): Payer: Medicare Other

## 2016-09-25 ENCOUNTER — Inpatient Hospital Stay (HOSPITAL_COMMUNITY): Payer: Medicare Other | Admitting: Occupational Therapy

## 2016-09-25 LAB — URINE CULTURE: Culture: 80000 — AB

## 2016-09-26 NOTE — Progress Notes (Signed)
AT 2235, heard different breathing pattern from patient's doorway, entered room, assessed why breathing pattern changed. Noted 02 off and tied with call bell at patient's side. Check 02 sats at 89% on RA. RT notified and recommended increased rate of 3,5L/min [place], Reapplied oxygen and Instructed patient not to remove oxygen. O2 sat increased to 92%-95% after o2 reapplied and increased to 3.5L/min via [place]. Rhonci continued to be noted as previously assessed. Continue Plan of Care. Gregory Hester

## 2016-09-29 LAB — CULTURE, BLOOD (ROUTINE X 2)
CULTURE: NO GROWTH
CULTURE: NO GROWTH
Special Requests: ADEQUATE
Special Requests: ADEQUATE

## 2016-09-29 LAB — AEROBIC/ANAEROBIC CULTURE W GRAM STAIN (SURGICAL/DEEP WOUND)

## 2016-09-29 LAB — AEROBIC/ANAEROBIC CULTURE (SURGICAL/DEEP WOUND)

## 2016-10-25 NOTE — Progress Notes (Signed)
Responded to pager. Received notice that [facility] had a patient expire unexpectedly, and they needed a chaplain STAT. Met with pt's nurse for update. She took me to room where family was gathered. Wife of pt. had a minister with her. Expressed my condolences. Ministry of presence. Let family and nurse know to page if spiritual care is needed further.   October 13, 2016 0800  Clinical Encounter Type  Visited With Family;Health care provider  Visit Type Spiritual support;Death  Referral From Nurse  Consult/Referral To Chaplain  Recommendations Nurse will pg. chaplain if further support is needed.  Spiritual Encounters  Spiritual Needs Grief support  Stress Factors  Family Stress Factors Loss

## 2016-10-25 NOTE — Death Summary Note (Signed)
DEATH SUMMARY   Patient Details  Name: Gregory Hester MRN: [id number] DOB: 1945/12/03  Admission/Discharge Information   Admit Date:  09/28/16  Date of Death: Date of Death: Oct 04, 2016  Time of Death: Time of Death: 0525  Length of Stay: May 09, 2022  Referring Physician: Patient, No Pcp Per   Reason(s) for Hospitalization  Paraplegia with T5 SCI due to fall and Fall  Diagnoses  Preliminary cause of death:  Cardiac arrest due to MI Secondary Diagnoses (including complications and co-morbidities):  Principal Problem:   Spinal cord injury, T1-T6 ([place]) Active Problems:   Neurogenic bladder   Colostomy status ([place])   Hypoalbuminemia due to protein-calorie malnutrition ([place])   Decubitus ulcer of sacral region, stage 4 ([place])   AKI (acute kidney injury) ([place])   Hypothyroidism   Diabetes mellitus type 2 in nonobese ([place])   Yeast infection   Anxiety state   Brief Hospital Course (including significant findings, care, treatment, and services provided and events leading to death)  Gregory Hester is a 71 y.o. year old male who was originally admitted to [facility] on 04/13/16 fall off aladder while working with SCI with incomplete T5-T6 paraplegia, multiple skull fractures, hangman's fracture and right distal radius fracture. He underwentT2-T6 posterior decompression, ORIF right distal radius fracture, tracheostomy-XLT and was discharged to [place] [facility] for vent wean on 05/12/16. He was extubated but trach dependent and sent to [facility] in [place] for 22 hours whereupon he was readmitted via ER to [facility] center for a month. He was treated for Aspiration pneumonitis(pea taken out with suctioning) with respiratory failure. Hospital course significant for worsening of dysphagia, stage IV sacral decub with osteomyelitis and cultures positive for VRE and pseudomonas. Palliative care consulted and patient elected on full medical treatment and he was transferred to [facility] on 08/23/16 as vent  dependent at nights. While at [facility], he completed his treatment for osteomyelitis on 7/23. He was treated with meropenum for ESBL Klebsiella UTI and was tolerating capping trials during the day since 7/24.   Medications being added to help with chronic systolic CHF and elevated blood pressures. Po intake was variable on dysphagia 3, nectar liquids. Sacral decub reported to be decreasing in size. He was improvement in activity tolerance and CIR was recommended for follow up therapy as felt to be a good rehab candidate.  Chart was reviewed and he was cleared medically for admission on 09-28-2016.  He had issues with anxiety and xanax was scheduled to help with mood. WOC was consulted for input and wound VAC applied to sacrum to help promote wound healing. This was replaced X 2 ad it kept coming off due to activity as well as drainage. Plastics was consulted for input and recommended Silver alginate dressing for antimicrobial properties as well as to absorb drainage. As respiratory status noted to be stable over the weekend he was decannulated by 7/30.  Foley was kept in place due to sacral wound and neurogenic bladder.  Urine culture showed >100,000 yeast and he was started on diflucan for treatment.   BLE dopplers were negative for DVT. On  8/1, patient was noted to be confused and noted to be hypoxic. Xanax was decreased and work up for source of  infection initiated due to leucocytosis. Stools were negative for C diff and Ativan was decreased and Chest CT showed LUL and BLL atelectasis with mild reactive mediastinal adenopathy. X ray of sacrum was negative for infection/abscess. On early am on Oct 05, 2022, patient was found unresponsive and  pulseless. Code Blue and ACLS protocol initiated with CPR X 15 minutes without ROSC. Patient expired on 10-09-16 at 0525 am.    Pertinent Labs and Studies  Significant Diagnostic Studies Dg Chest 2 View  Result Date: 09/24/2016 CLINICAL DATA:  Chest congestion and hypoxia today.  EXAM: CHEST  2 VIEW COMPARISON:  Chest x-ray of September 19, 2016 FINDINGS: The lungs are adequately inflated. The lung markings are coarse in the retrocardiac region but these are stable. The heart is mildly enlarged. The pulmonary vascularity is not engorged. The patient has undergone previous CABG as well as upper posterior thoracic spinal fusion. A PEG tube balloon is visible in the upper abdomen. IMPRESSION: Persistent increased density in the retrocardiac region presumably on the left. This may reflect atelectasis or pneumonia. Stable cardiomegaly without pulmonary vascular congestion or pulmonary edema. Further evaluation with chest CT scanning is recommended to better evaluate the left lower lobe. Previous CABG. Thoracic aortic atherosclerosis. Electronically Signed   By: David  Martinique M.D.   On: 09/24/2016 13:51   Dg Chest 2 View  Result Date: 09/12/2016 CLINICAL DATA:  Patient with fall from ladder on 04/11/2016, paraplegia. Respiratory failure. Pneumonia. EXAM: CHEST  2 VIEW COMPARISON:  Chest radiograph 09/07/2016. FINDINGS: Limited rotated portable exam. Tracheostomy tube projects over the mid trachea. Stable enlarged cardiac and mediastinal contours status post median sternotomy. Unchanged superior fractured sternotomy wires. Heterogeneous opacities left lung base. Probable small left pleural effusion. Upper thoracic spine fusion hardware. Percutaneous gastrostomy tube. IMPRESSION: Limited rotated portable exam. Probable small left pleural effusion and underlying opacities which may represent atelectasis or infection. Electronically Signed   By: Lovey Newcomer M.D.   On: 09/04/2016 18:31   Dg Sacrum/coccyx  Result Date: 09/24/2016 CLINICAL DATA:  Chronic cutaneous sore over the sacrum for the past 2 months. Possible osteomyelitis. EXAM: SACRUM AND COCCYX - 2+ VIEW COMPARISON:  Abdominopelvic CT scan dated September 15, 2016 FINDINGS: The bones are subjectively osteopenic. No acute bony abnormality of the  sacrum is observed. IMPRESSION: No acute abnormality in the sacrum is observed. Electronically Signed   By: David  Martinique M.D.   On: 09/24/2016 13:52   Ct Head Wo Contrast  Result Date: 09/02/2016 CLINICAL DATA:  71 year old male with headache and bleeding. EXAM: CT HEAD WITHOUT CONTRAST TECHNIQUE: Contiguous axial images were obtained from the base of the skull through the vertex without intravenous contrast. COMPARISON:  None. FINDINGS: Brain: There is moderate age-related atrophy and chronic microvascular ischemic changes. There is no acute intracranial hemorrhage. No mass effect or midline shift. No extra-axial fluid collection. Vascular: No hyperdense vessel or unexpected calcification. Skull: Normal. Negative for fracture or focal lesion. Sinuses/Orbits: Mild diffuse mucoperiosteal thickening of paranasal sinuses. No air-fluid levels. Small right maxillary sinus retention cyst or polyp. The mastoid air cells are clear. Other: None IMPRESSION: 1. No acute intracranial hemorrhage. 2. Age-related atrophy and chronic microvascular ischemic changes. Electronically Signed   By: Anner Crete M.D.   On: 09/02/2016 03:54   Ct Chest W Contrast  Result Date: 09/24/2016 CLINICAL DATA:  Shortness of breath. Persistent retrocardiac density on chest radiographs. EXAM: CT CHEST WITH CONTRAST TECHNIQUE: Multidetector CT imaging of the chest was performed during intravenous contrast administration. CONTRAST:  75 cc ISOVUE-300 IOPAMIDOL (ISOVUE-300) INJECTION 61% COMPARISON:  Chest radiographs obtained earlier today. FINDINGS: Cardiovascular: Mildly enlarged heart. Arterial calcifications, including dense coronary artery calcifications as well as aortic calcifications. Post CABG changes. Mediastinum/Nodes: Mildly prominent subcarinal node with a short axis diameter of 10 mm  on image number 82 of series 3. Mildly prominent AP window lymph node with a short axis diameter of 14 mm on image number 60 of series 3. No  enlarged hilar or axillary lymph nodes are seen. The thyroid gland is partially obscured by streak artifact with no gross nodules seen. Unremarkable esophagus. Lungs/Pleura: Small left pleural effusion. Bilateral lower lobe atelectasis as well as posterior left upper lobe atelectasis. No lung nodules or masses seen. Upper Abdomen: PEG tube in the mid stomach. Musculoskeletal: Thoracic spine degenerative changes. Cervical spine and upper thoracic spine fixation hardware. IMPRESSION: 1. Mild mediastinal adenopathy, most likely reactive. 2. Left upper lobe and bilateral lower lobe atelectasis. 3. Small left pleural effusion. 4. Dense calcified coronary artery atherosclerosis as well as calcified aortic atherosclerosis. 5. Mild cardiomegaly. Aortic Atherosclerosis (ICD10-I70.0). Electronically Signed   By: Claudie Revering M.D.   On: 09/24/2016 18:10    Microbiology Recent Results (from the past 240 hour(s))  Culture, Urine     Status: Abnormal   Collection Time: 09/02/2016  7:00 PM  Result Value Ref Range Status   Specimen Description URINE, CATHETERIZED  Final   Special Requests NONE  Final   Culture >=100,000 COLONIES/mL YEAST (A)  Final   Report Status 09/21/2016 FINAL  Final  Culture, Urine     Status: Abnormal   Collection Time: 09/24/16 12:57 PM  Result Value Ref Range Status   Specimen Description URINE, CATHETERIZED  Final   Special Requests NONE  Final   Culture 80,000 COLONIES/mL YEAST (A)  Final   Report Status 09/30/2016 FINAL  Final  C difficile quick scan w PCR reflex     Status: None   Collection Time: 09/24/16  1:00 PM  Result Value Ref Range Status   C Diff antigen NEGATIVE NEGATIVE Final   C Diff toxin NEGATIVE NEGATIVE Final   C Diff interpretation No C. difficile detected.  Final  Culture, blood (routine x 2)     Status: None (Preliminary result)   Collection Time: 09/24/16  1:58 PM  Result Value Ref Range Status   Specimen Description BLOOD RIGHT ARM  Final   Special Requests  IN PEDIATRIC BOTTLE Blood Culture adequate volume  Final   Culture NO GROWTH 1 DAY  Final   Report Status PENDING  Incomplete  Culture, blood (routine x 2)     Status: None (Preliminary result)   Collection Time: 09/24/16  1:58 PM  Result Value Ref Range Status   Specimen Description BLOOD LEFT ARM  Final   Special Requests IN PEDIATRIC BOTTLE Blood Culture adequate volume  Final   Culture NO GROWTH 1 DAY  Final   Report Status PENDING  Incomplete  Aerobic/Anaerobic Culture (surgical/deep wound)     Status: None (Preliminary result)   Collection Time: 09/24/16  5:03 PM  Result Value Ref Range Status   Specimen Description SACRAL  Final   Special Requests NONE  Final   Gram Stain   Final    ABUNDANT WBC PRESENT, PREDOMINANTLY PMN NO ORGANISMS SEEN    Culture   Final    RARE PSEUDOMONAS AERUGINOSA SUSCEPTIBILITIES TO FOLLOW NO ANAEROBES ISOLATED; CULTURE IN PROGRESS FOR 5 DAYS    Report Status PENDING  Incomplete    Lab Basic Metabolic Panel:  Recent Labs Lab 09/20/16 0510 09/24/16 1026  NA 139 137  K 3.3* 4.2  CL 102 101  CO2 29 26  GLUCOSE 116* 118*  BUN 42* 32*  CREATININE 1.05 1.19  CALCIUM 9.1 9.4  Liver Function Tests:  Recent Labs Lab 09/20/16 0510 09/24/16 1026  AST 20 16  ALT 14* 11*  ALKPHOS 88 97  BILITOT 0.2* 0.5  PROT 6.0* 6.5  ALBUMIN 2.1* 2.4*   No results for input(s): LIPASE, AMYLASE in the last 168 hours. No results for input(s): AMMONIA in the last 168 hours. CBC:  Recent Labs Lab 09/20/16 0510 09/24/16 1026  WBC 10.4 20.1*  NEUTROABS 5.8  --   HGB 9.6* 10.1*  HCT 31.3* 32.9*  MCV 84.6 85.9  PLT 374 419*   Cardiac Enzymes: No results for input(s): CKTOTAL, CKMB, CKMBINDEX, TROPONINI in the last 168 hours. Sepsis Labs:  Recent Labs Lab 09/20/16 0510 09/24/16 1026  WBC 10.4 20.1*    Procedures/Operations  N/A   Bary Leriche 09/26/2016, 12:59 PM

## 2016-10-25 NOTE — Progress Notes (Signed)
Late Entry: Approximately [place] calling patient's name and patient not responding. Entered patient's room, patient unresponsive, laying on left side, flat on back, more pale in appearance. Called out to patient, shook patient's right shoulder, still no response. No pulse noted code blue initiated.CPR initiated. Code Blue team arrived attempted to revive patient, unsuccessful. Notified NP, Danella Sensing at approximately at [place] of patient's status.Notified wife concerning event approximately 0600 noting there had been a change in her husband's condition and have someone to drive her to the hospital. Hospital protocol was followed throughout process. Timoteo Gaul

## 2016-10-25 NOTE — Code Documentation (Signed)
  Patient Name: Gregory Hester   MRN: [id number]   Date of Birth/ Sex: 07-Nov-1945 , male      Admission Date: 09/08/2016  Attending Provider: Meredith Staggers, MD  Primary Diagnosis: SCI   Indication: Pt was in his usual state of health until this AM 0510 , when he was noted to be unresponsive, without a pulse by his nurse. Code blue was subsequently called. At the time of arrival on scene, ACLS protocol was underway.  Pt found to be in Asystole, the initial arrest was unwitnessed, the nurse called code blue.   Technical Description:  - CPR performance duration:                15  minutes  - Was defibrillation or cardioversion used? no   - Was external pacer placed? no  - Was patient intubated pre/post CPR? yes   Medications Administered: Y = Yes; Blank = No Amiodarone    Atropine    Calcium    Epinephrine  Y  Lidocaine    Magnesium    Norepinephrine    Phenylephrine    Sodium bicarbonate  Y  Vasopressin    Other    Post CPR evaluation:  - Final Status - Was patient successfully resuscitated ? No   Miscellaneous Information:  - Time of death:  45 AM  - Primary team notified?  Yes  - Family Notified? Yes     Katherine Roan, MD   October 13, 2016, 6:41 AM

## 2016-10-25 NOTE — Progress Notes (Signed)
Patient prepared as instructed for transport to the morgue.  Patients spouse, Opal Sidles, and friends took possession of patients personal belongings.  All lines still intact.  Spouse is being comforted by family on unit.  See post mortem flowsheet for specifics.  Brita Romp, RN

## 2016-10-25 DEATH — deceased

## 2018-06-26 IMAGING — DX DG SACRUM/COCCYX 2+V
3 series · 3 of 3 positions shown · non-contrast
Comparison: Abdominopelvic CT scan dated September 15, 2016

CLINICAL DATA: Chronic cutaneous sore over the sacrum for the past
2 months. Possible osteomyelitis.

EXAM:
SACRUM AND COCCYX - 2+ VIEW

[t sacrum ap]
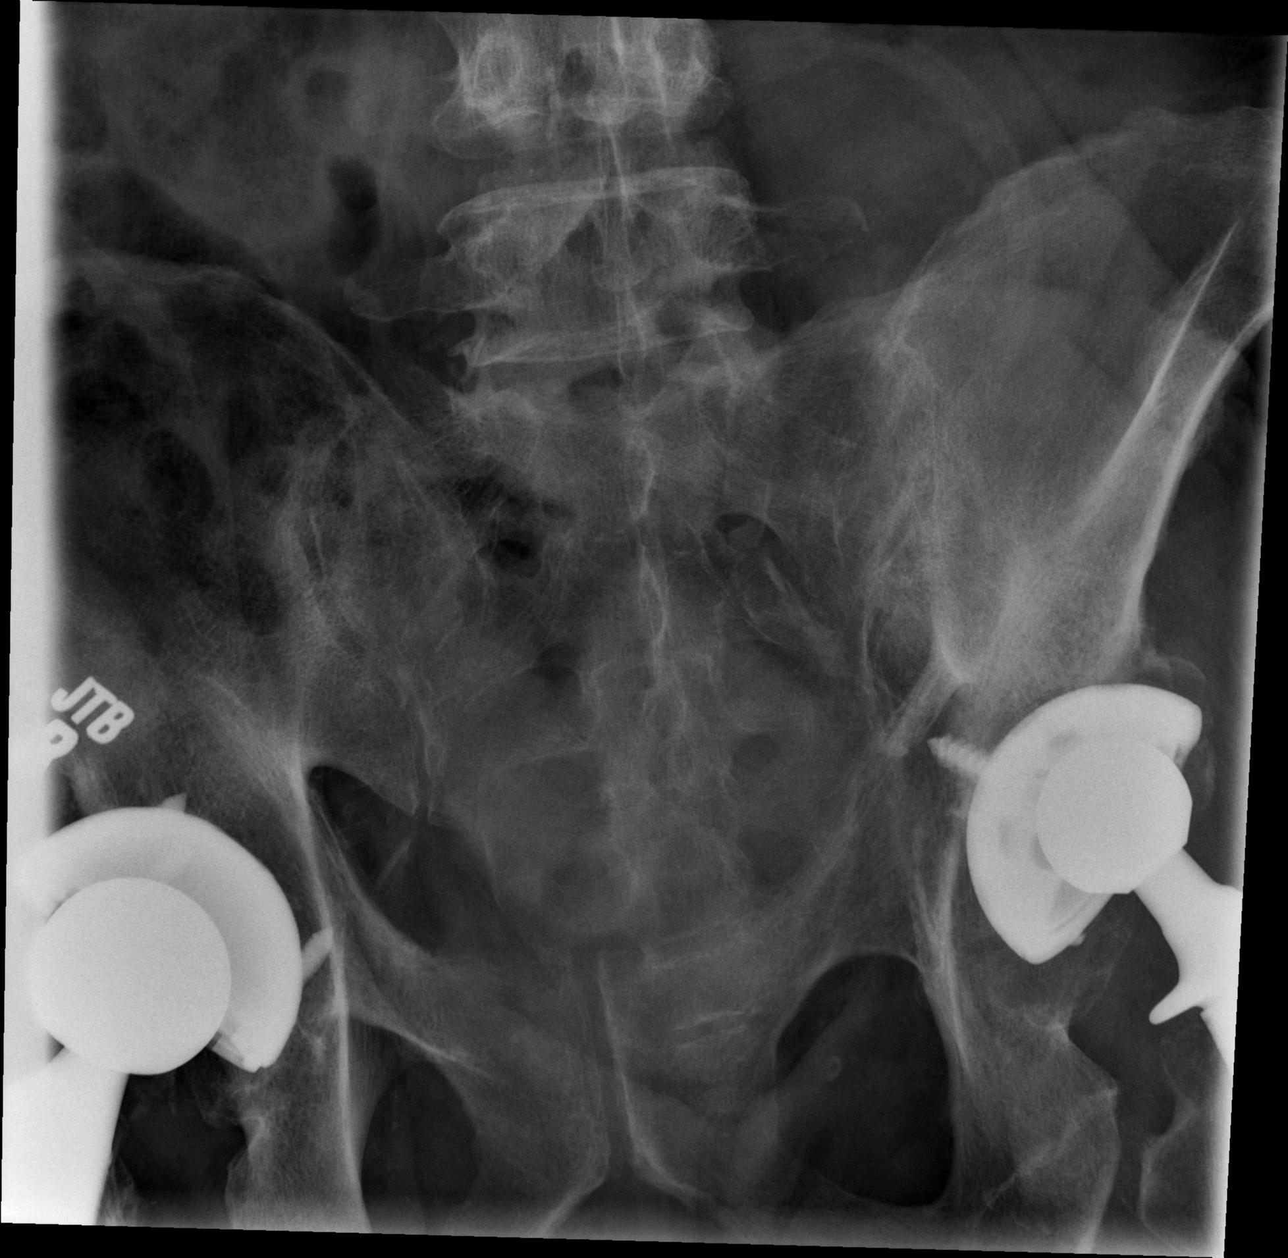

[t coccyx ap]
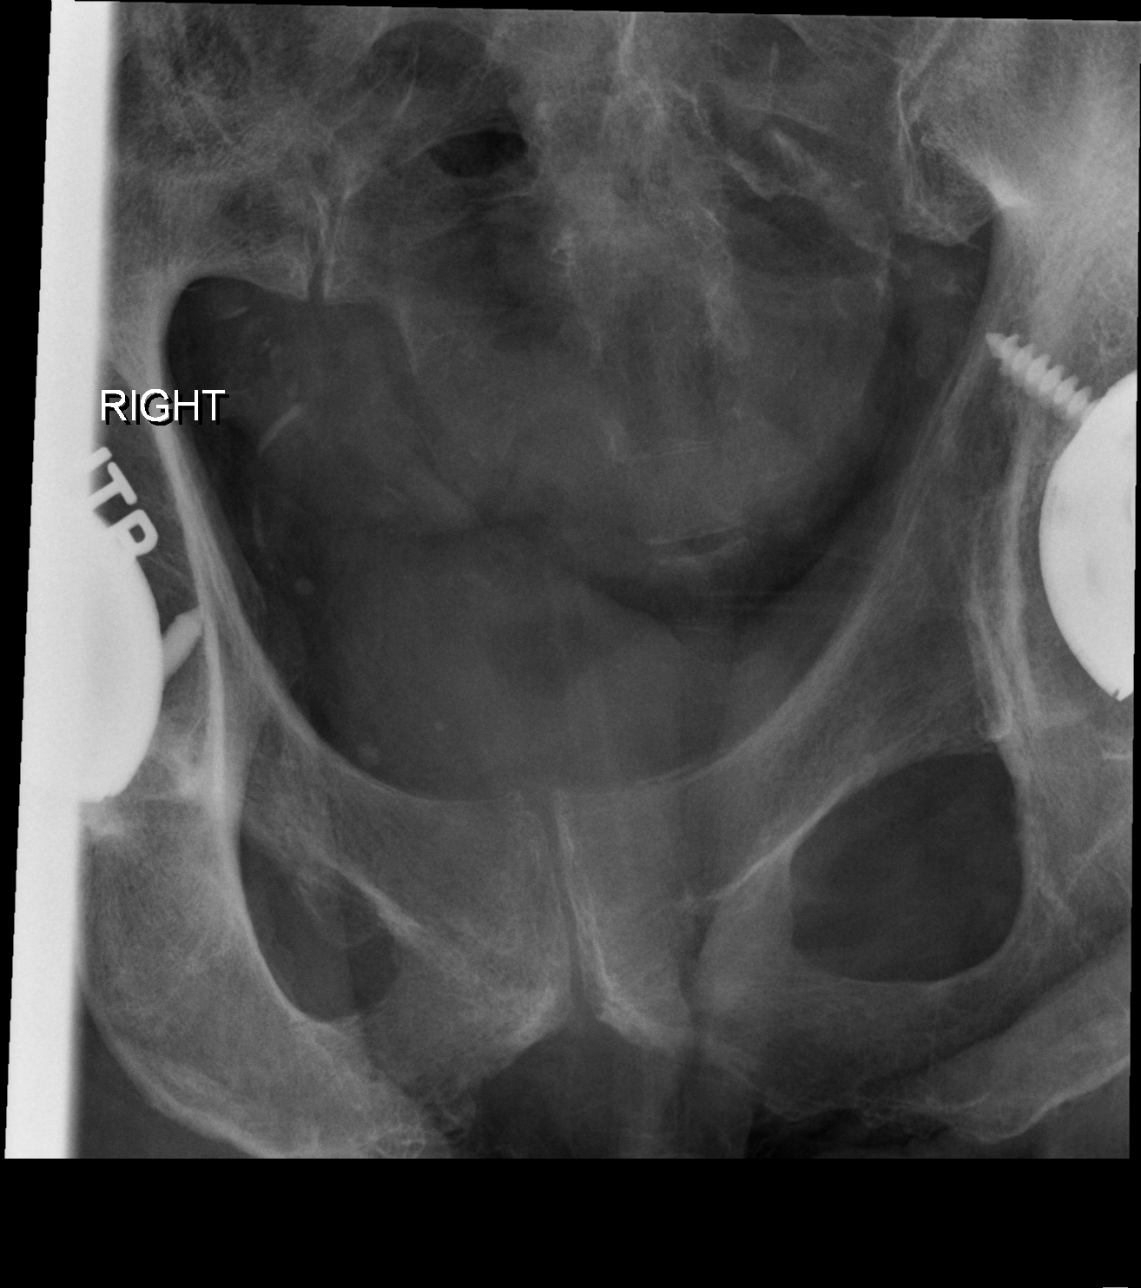

[t sacrum coccyx lat]
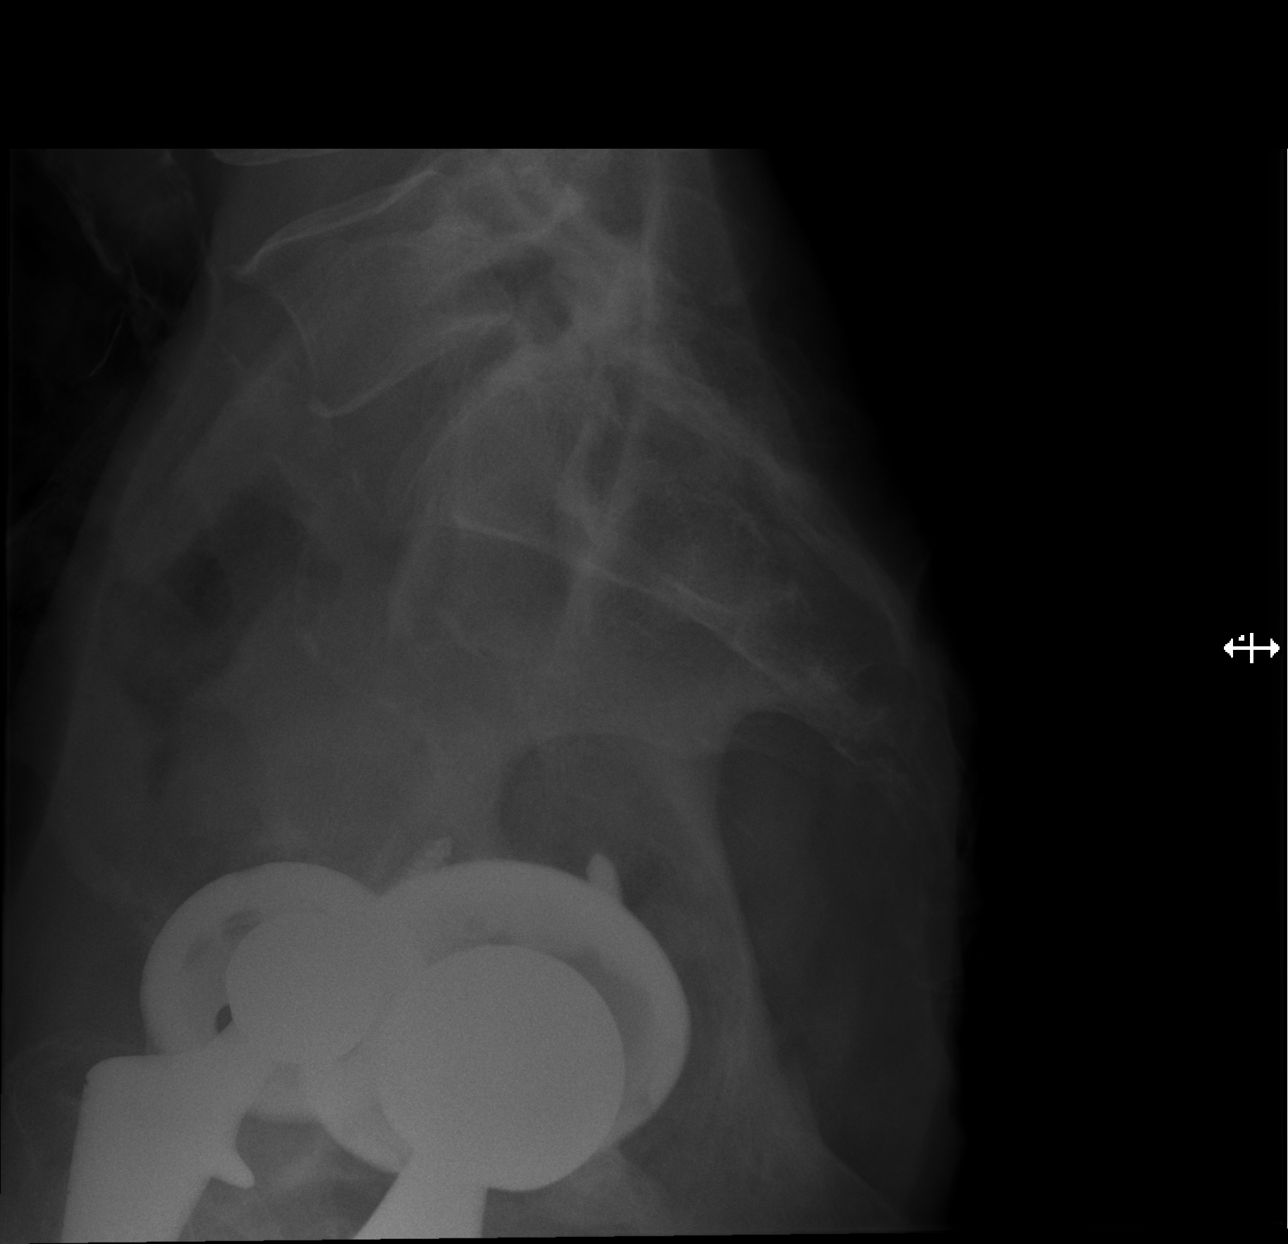

[3 of 3 positions shown; findings below may reference images not displayed]

FINDINGS: The bones are subjectively osteopenic. No acute bony abnormality of
the sacrum is observed..
IMPRESSION: No acute abnormality in the sacrum is observed.

## 2018-06-26 IMAGING — DX DG CHEST 2V
2 series · 2 of 2 positions shown · non-contrast
Comparison: Chest x-ray of September 19, 2016

CLINICAL DATA: Chest congestion and hypoxia today.

EXAM:
CHEST  2 VIEW

[x chest ap]
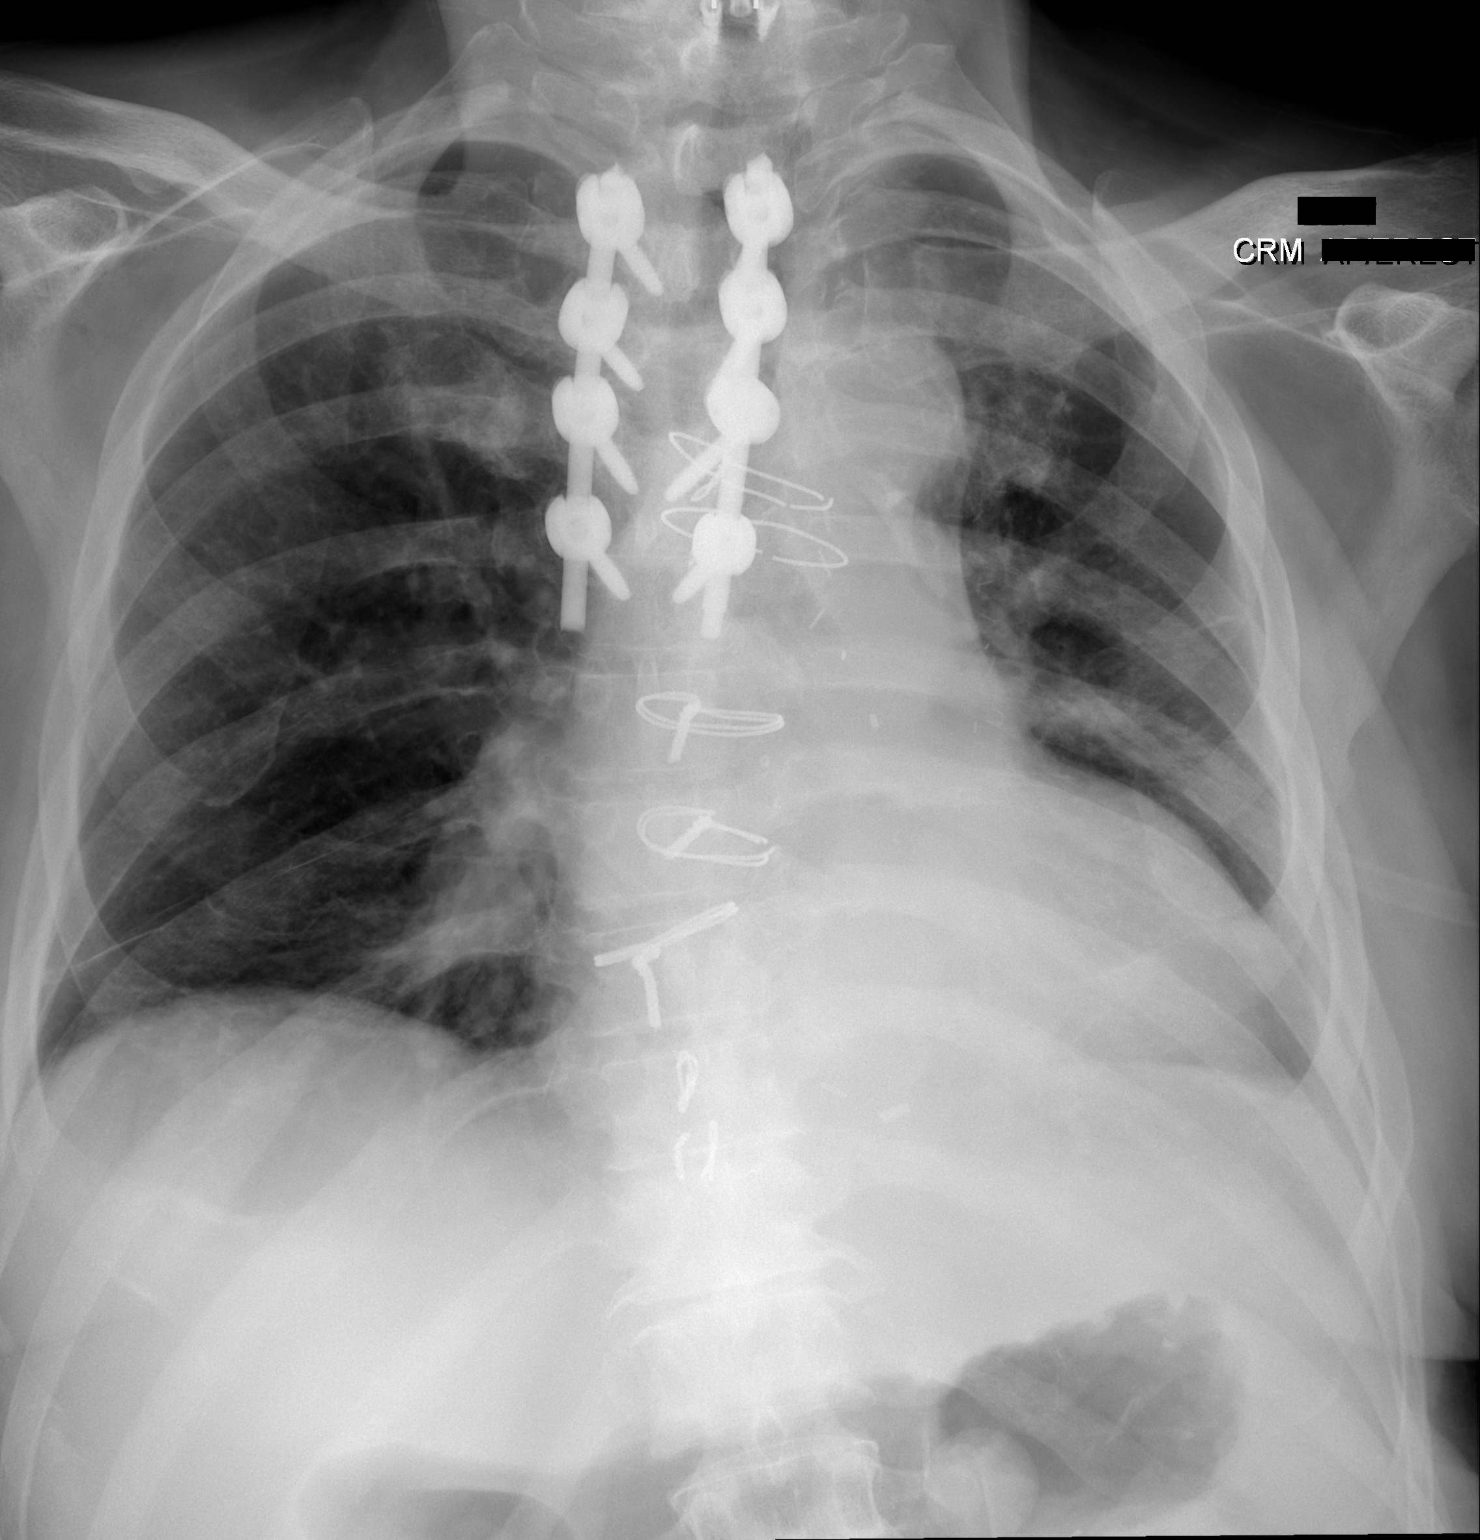

[w chest lat]
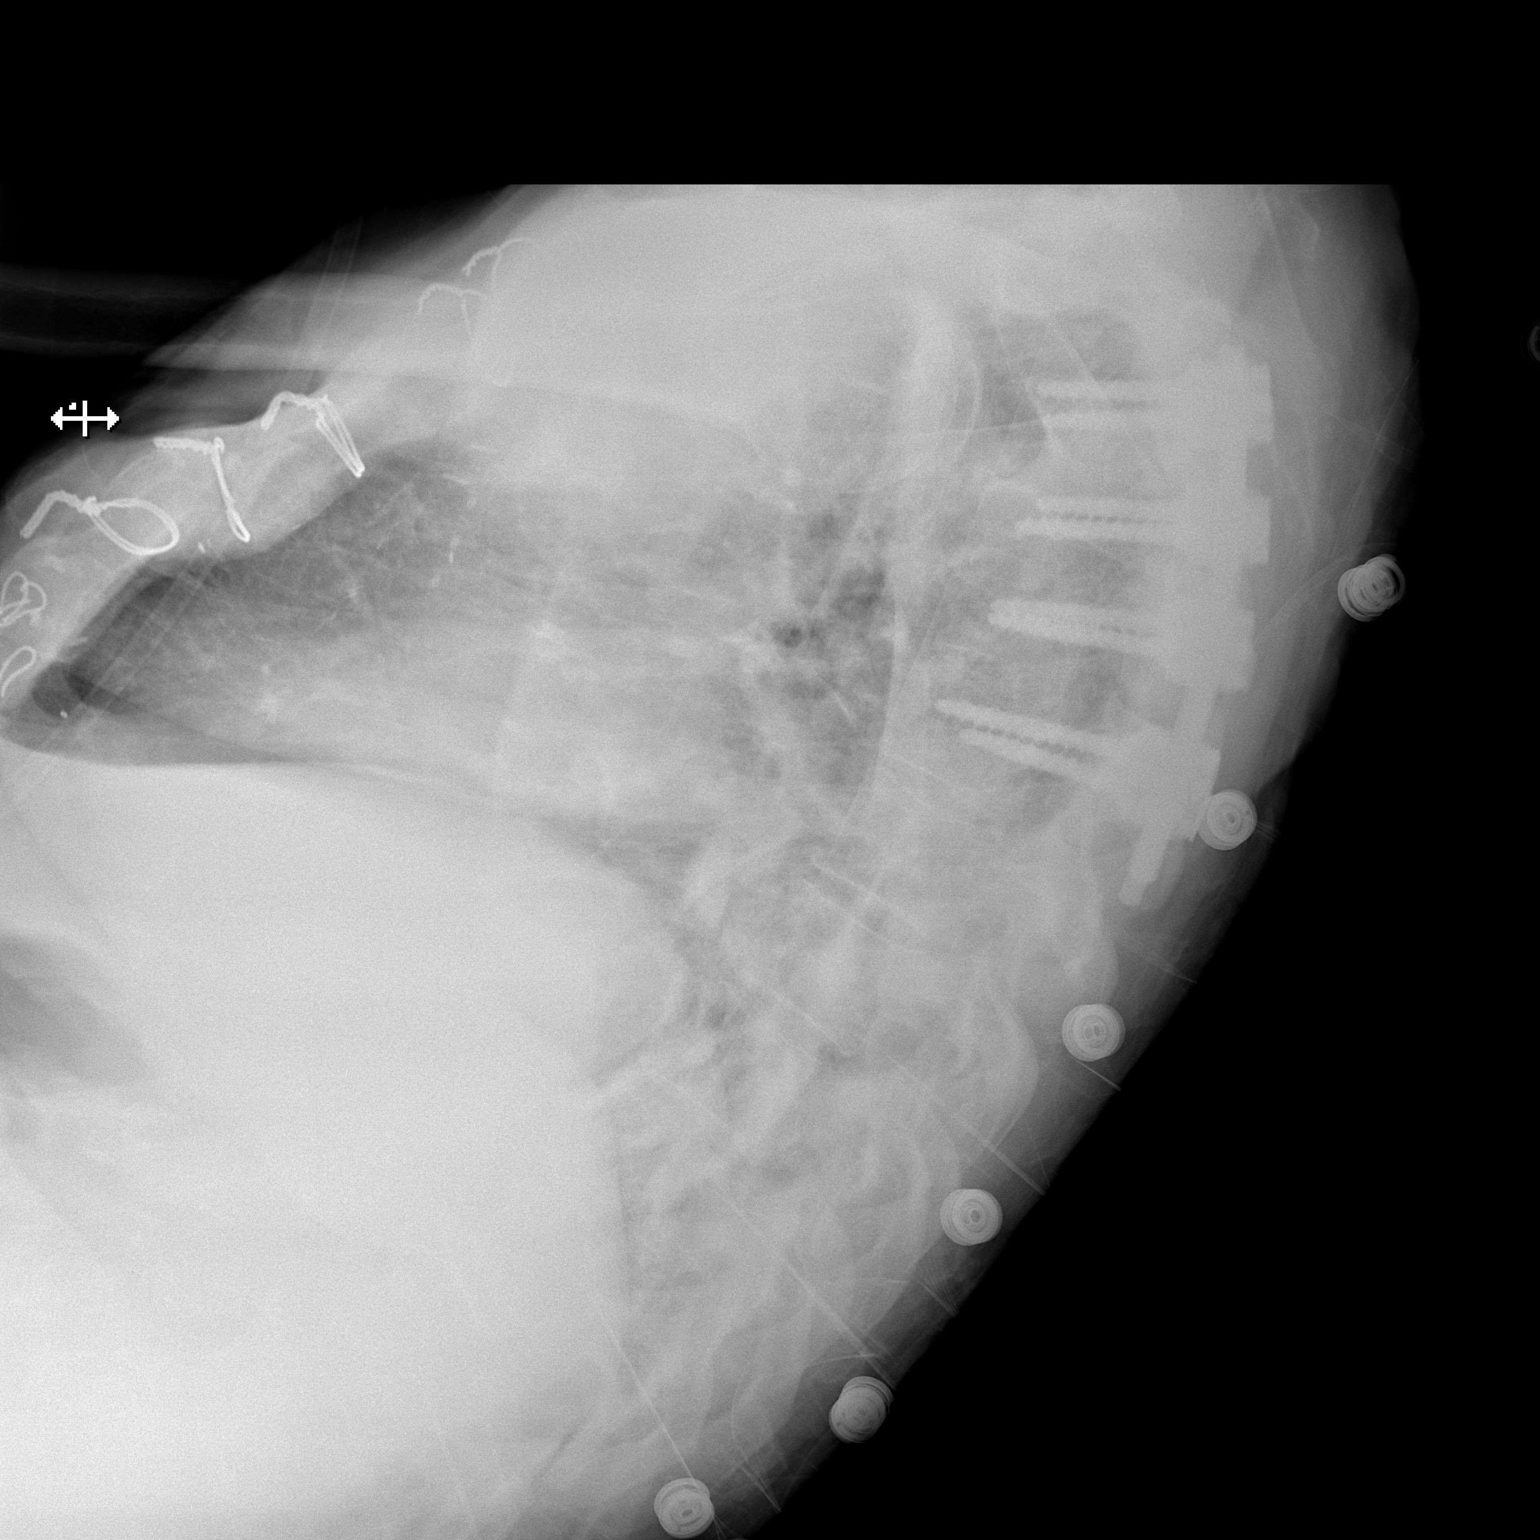

[2 of 2 positions shown; findings below may reference images not displayed]

FINDINGS: The lungs are adequately inflated. The lung markings are coarse in
the retrocardiac region but these are stable. The heart is mildly
enlarged. The pulmonary vascularity is not engorged. The patient has
undergone previous CABG as well as upper posterior thoracic spinal
fusion. A PEG tube balloon is visible in the upper abdomen.
IMPRESSION: Persistent increased density in the retrocardiac region presumably
on the left. This may reflect atelectasis or pneumonia. Stable
cardiomegaly without pulmonary vascular congestion or pulmonary
edema. Further evaluation with chest CT scanning is recommended to
better evaluate the left lower lobe.

Previous CABG.

Thoracic aortic atherosclerosis.
